# Patient Record
Sex: Female | Born: 1955 | Race: White | Hispanic: No | Smoking: Current every day smoker
Health system: Southern US, Community
[De-identification: ages and names within clinical notes are randomized; demographics above are authoritative.]

## PROBLEM LIST (undated history)

## (undated) DIAGNOSIS — M545 Low back pain, unspecified: Secondary | ICD-10-CM

## (undated) DIAGNOSIS — K219 Gastro-esophageal reflux disease without esophagitis: Secondary | ICD-10-CM

## (undated) DIAGNOSIS — S42401A Unspecified fracture of lower end of right humerus, initial encounter for closed fracture: Secondary | ICD-10-CM

## (undated) DIAGNOSIS — I509 Heart failure, unspecified: Secondary | ICD-10-CM

## (undated) DIAGNOSIS — R0602 Shortness of breath: Secondary | ICD-10-CM

## (undated) DIAGNOSIS — M5136 Other intervertebral disc degeneration, lumbar region: Secondary | ICD-10-CM

## (undated) DIAGNOSIS — R609 Edema, unspecified: Secondary | ICD-10-CM

## (undated) DIAGNOSIS — T148XXA Other injury of unspecified body region, initial encounter: Secondary | ICD-10-CM

## (undated) DIAGNOSIS — M1990 Unspecified osteoarthritis, unspecified site: Secondary | ICD-10-CM

## (undated) DIAGNOSIS — F419 Anxiety disorder, unspecified: Secondary | ICD-10-CM

## (undated) DIAGNOSIS — M51369 Other intervertebral disc degeneration, lumbar region without mention of lumbar back pain or lower extremity pain: Secondary | ICD-10-CM

## (undated) DIAGNOSIS — G8929 Other chronic pain: Secondary | ICD-10-CM

## (undated) DIAGNOSIS — E785 Hyperlipidemia, unspecified: Secondary | ICD-10-CM

## (undated) DIAGNOSIS — I10 Essential (primary) hypertension: Secondary | ICD-10-CM

## (undated) DIAGNOSIS — M5126 Other intervertebral disc displacement, lumbar region: Secondary | ICD-10-CM

## (undated) HISTORY — PX: FRACTURE SURGERY: SHX138

## (undated) HISTORY — DX: Heart failure, unspecified: I50.9

## (undated) HISTORY — DX: Anxiety disorder, unspecified: F41.9

## (undated) HISTORY — DX: Other injury of unspecified body region, initial encounter: T14.8XXA

## (undated) HISTORY — DX: Hyperlipidemia, unspecified: E78.5

## (undated) HISTORY — PX: TARSAL TUNNEL RELEASE: SUR1099

---

## 2000-12-16 HISTORY — PX: HEEL SPUR SURGERY: SHX665

## 2001-07-10 ENCOUNTER — Encounter (HOSPITAL_COMMUNITY): Admission: RE | Admit: 2001-07-10 | Discharge: 2001-08-09 | Payer: Self-pay | Admitting: Podiatry

## 2002-03-22 ENCOUNTER — Encounter: Payer: Self-pay | Admitting: Family Medicine

## 2002-03-22 ENCOUNTER — Ambulatory Visit (HOSPITAL_COMMUNITY): Admission: RE | Admit: 2002-03-22 | Discharge: 2002-03-22 | Payer: Self-pay | Admitting: Family Medicine

## 2002-11-22 ENCOUNTER — Ambulatory Visit (HOSPITAL_COMMUNITY): Admission: RE | Admit: 2002-11-22 | Discharge: 2002-11-22 | Payer: Self-pay | Admitting: Anesthesiology

## 2002-11-22 ENCOUNTER — Encounter: Payer: Self-pay | Admitting: Anesthesiology

## 2003-02-21 ENCOUNTER — Encounter: Payer: Self-pay | Admitting: Emergency Medicine

## 2003-02-21 ENCOUNTER — Emergency Department (HOSPITAL_COMMUNITY): Admission: EM | Admit: 2003-02-21 | Discharge: 2003-02-21 | Payer: Self-pay | Admitting: Emergency Medicine

## 2005-04-23 ENCOUNTER — Encounter (HOSPITAL_COMMUNITY): Admission: RE | Admit: 2005-04-23 | Discharge: 2005-05-23 | Payer: Self-pay | Admitting: Podiatry

## 2005-12-04 ENCOUNTER — Ambulatory Visit (HOSPITAL_COMMUNITY): Admission: RE | Admit: 2005-12-04 | Discharge: 2005-12-04 | Payer: Self-pay | Admitting: Family Medicine

## 2005-12-10 ENCOUNTER — Ambulatory Visit (HOSPITAL_COMMUNITY): Admission: RE | Admit: 2005-12-10 | Discharge: 2005-12-10 | Payer: Self-pay | Admitting: Family Medicine

## 2005-12-11 ENCOUNTER — Encounter (HOSPITAL_COMMUNITY): Admission: RE | Admit: 2005-12-11 | Discharge: 2005-12-11 | Payer: Self-pay | Admitting: Family Medicine

## 2005-12-17 ENCOUNTER — Encounter (HOSPITAL_COMMUNITY): Admission: RE | Admit: 2005-12-17 | Discharge: 2006-01-16 | Payer: Self-pay | Admitting: Family Medicine

## 2009-04-03 ENCOUNTER — Other Ambulatory Visit: Admission: RE | Admit: 2009-04-03 | Discharge: 2009-04-03 | Payer: Self-pay | Admitting: Obstetrics & Gynecology

## 2009-04-10 ENCOUNTER — Ambulatory Visit (HOSPITAL_COMMUNITY): Admission: RE | Admit: 2009-04-10 | Discharge: 2009-04-10 | Payer: Self-pay | Admitting: Obstetrics & Gynecology

## 2009-04-24 ENCOUNTER — Ambulatory Visit (HOSPITAL_COMMUNITY): Admission: RE | Admit: 2009-04-24 | Discharge: 2009-04-24 | Payer: Self-pay | Admitting: Family Medicine

## 2009-05-04 ENCOUNTER — Ambulatory Visit (HOSPITAL_COMMUNITY): Admission: RE | Admit: 2009-05-04 | Discharge: 2009-05-04 | Payer: Self-pay | Admitting: Obstetrics & Gynecology

## 2010-03-30 ENCOUNTER — Ambulatory Visit (HOSPITAL_COMMUNITY): Admission: RE | Admit: 2010-03-30 | Discharge: 2010-03-30 | Payer: Self-pay | Admitting: Family Medicine

## 2010-04-10 ENCOUNTER — Ambulatory Visit (HOSPITAL_COMMUNITY): Admission: RE | Admit: 2010-04-10 | Discharge: 2010-04-10 | Payer: Self-pay | Admitting: Family Medicine

## 2010-04-26 ENCOUNTER — Encounter: Admission: RE | Admit: 2010-04-26 | Discharge: 2010-04-26 | Payer: Self-pay | Admitting: Family Medicine

## 2011-01-10 ENCOUNTER — Ambulatory Visit (HOSPITAL_COMMUNITY)
Admission: RE | Admit: 2011-01-10 | Discharge: 2011-01-10 | Payer: Self-pay | Source: Home / Self Care | Attending: Family Medicine | Admitting: Family Medicine

## 2011-01-21 ENCOUNTER — Ambulatory Visit (HOSPITAL_COMMUNITY)
Admission: RE | Admit: 2011-01-21 | Discharge: 2011-01-21 | Disposition: A | Discharge status: Home / Self Care | Payer: Medicare Other | Source: Ambulatory Visit | Attending: Family Medicine | Admitting: Family Medicine

## 2011-01-21 ENCOUNTER — Encounter (HOSPITAL_COMMUNITY): Payer: Self-pay

## 2011-01-21 ENCOUNTER — Other Ambulatory Visit (HOSPITAL_COMMUNITY): Payer: Self-pay | Admitting: Family Medicine

## 2011-01-21 DIAGNOSIS — M545 Low back pain, unspecified: Secondary | ICD-10-CM | POA: Insufficient documentation

## 2011-01-21 DIAGNOSIS — R531 Weakness: Secondary | ICD-10-CM

## 2011-01-21 HISTORY — DX: Essential (primary) hypertension: I10

## 2011-02-18 ENCOUNTER — Other Ambulatory Visit: Payer: Self-pay | Admitting: Family Medicine

## 2011-02-18 DIAGNOSIS — M549 Dorsalgia, unspecified: Secondary | ICD-10-CM

## 2011-03-04 ENCOUNTER — Ambulatory Visit
Admission: RE | Admit: 2011-03-04 | Discharge: 2011-03-04 | Disposition: A | Discharge status: Home / Self Care | Payer: Medicare Other | Source: Ambulatory Visit | Attending: Family Medicine | Admitting: Family Medicine

## 2011-03-04 DIAGNOSIS — M549 Dorsalgia, unspecified: Secondary | ICD-10-CM

## 2014-03-08 ENCOUNTER — Other Ambulatory Visit (HOSPITAL_COMMUNITY): Payer: Self-pay | Admitting: Pain Medicine

## 2014-03-08 ENCOUNTER — Ambulatory Visit (HOSPITAL_COMMUNITY)
Admission: RE | Admit: 2014-03-08 | Discharge: 2014-03-08 | Disposition: A | Discharge status: Home / Self Care | Payer: Medicare Other | Source: Ambulatory Visit | Attending: Pain Medicine | Admitting: Pain Medicine

## 2014-03-08 DIAGNOSIS — IMO0002 Reserved for concepts with insufficient information to code with codable children: Secondary | ICD-10-CM | POA: Insufficient documentation

## 2014-03-08 DIAGNOSIS — M545 Low back pain, unspecified: Secondary | ICD-10-CM

## 2014-03-08 DIAGNOSIS — M5430 Sciatica, unspecified side: Secondary | ICD-10-CM

## 2014-03-08 DIAGNOSIS — M1710 Unilateral primary osteoarthritis, unspecified knee: Secondary | ICD-10-CM | POA: Insufficient documentation

## 2014-03-08 DIAGNOSIS — M25569 Pain in unspecified knee: Secondary | ICD-10-CM | POA: Insufficient documentation

## 2015-08-24 ENCOUNTER — Ambulatory Visit (INDEPENDENT_AMBULATORY_CARE_PROVIDER_SITE_OTHER): Payer: Medicare Other | Admitting: Adult Health

## 2015-08-24 ENCOUNTER — Encounter: Payer: Self-pay | Admitting: Adult Health

## 2015-08-24 ENCOUNTER — Other Ambulatory Visit (HOSPITAL_COMMUNITY)
Admission: RE | Admit: 2015-08-24 | Discharge: 2015-08-24 | Disposition: A | Discharge status: Home / Self Care | Payer: Medicare Other | Source: Ambulatory Visit | Attending: Adult Health | Admitting: Adult Health

## 2015-08-24 VITALS — BP 100/60 | HR 92 | Ht 62.0 in | Wt 226.5 lb

## 2015-08-24 DIAGNOSIS — Z124 Encounter for screening for malignant neoplasm of cervix: Secondary | ICD-10-CM | POA: Insufficient documentation

## 2015-08-24 DIAGNOSIS — Z1151 Encounter for screening for human papillomavirus (HPV): Secondary | ICD-10-CM | POA: Insufficient documentation

## 2015-08-24 DIAGNOSIS — Z1212 Encounter for screening for malignant neoplasm of rectum: Secondary | ICD-10-CM

## 2015-08-24 DIAGNOSIS — Z139 Encounter for screening, unspecified: Secondary | ICD-10-CM

## 2015-08-24 LAB — HEMOCCULT GUIAC POC 1CARD (OFFICE): FECAL OCCULT BLD: NEGATIVE

## 2015-08-24 NOTE — Progress Notes (Signed)
Subjective:     Patient ID: Marie Juarez, female   DOB: Aug 07, 1956, 59 y.o.   MRN: [id number]  HPI Marie Juarez is a 59 year old white female,widowed in for pelvic exam and pap, last pap about 2010.She had her physical with Dr Claudette Laws PCP.  Review of Systems Patient denies any headaches, hearing loss, fatigue, blurred vision, shortness of breath, chest pain, abdominal pain, problems with bowel movements, urination, or intercourse(not having sex) . No joint pain or mood swings.Does have pain in feet and legs from nerve damage.  Reviewed past medical,surgical, social and family history. Reviewed medications and allergies.     Objective:   Physical Exam BP 100/60 mmHg  Pulse 92  Ht  (1.575 m)  Wt 226 lb 8 oz (102.74 kg)  BMI 41.42 kg/m2 Skin warm and dry.Pelvic: external genitalia is normal in appearance no lesions, vagina: has decreased color, moisture and rugae,urethra has no lesions or masses noted, cervix:smooth and stenotic at os, pap with HPV performed, uterus: normal size, shape and contour, non tender, no masses felt, adnexa: no masses or tenderness noted. Bladder is non tender and no masses felt. On rectal exam no masses felt, hemoccult negative, discussed if sees any vaginal bleeding let us know, and needs to get mammogram and colonoscopy.    Assessment:     Pelvic exam with pap    Plan:     Pap in 3 years if normal Get mammogram  Colonoscopy advised

## 2015-08-24 NOTE — Patient Instructions (Signed)
Pap smear in 3 years if this one normal Get mammogram Colonoscopy advised

## 2015-08-28 LAB — CYTOLOGY - PAP

## 2016-11-23 ENCOUNTER — Emergency Department (HOSPITAL_COMMUNITY)
Admission: EM | Admit: 2016-11-23 | Discharge: 2016-11-23 | Disposition: A | Discharge status: Home / Self Care | Payer: Medicare Other | Attending: Emergency Medicine | Admitting: Emergency Medicine

## 2016-11-23 ENCOUNTER — Encounter (HOSPITAL_COMMUNITY): Payer: Self-pay | Admitting: *Deleted

## 2016-11-23 ENCOUNTER — Emergency Department (HOSPITAL_COMMUNITY): Payer: Medicare Other

## 2016-11-23 DIAGNOSIS — S53104A Unspecified dislocation of right ulnohumeral joint, initial encounter: Secondary | ICD-10-CM

## 2016-11-23 DIAGNOSIS — I10 Essential (primary) hypertension: Secondary | ICD-10-CM | POA: Insufficient documentation

## 2016-11-23 DIAGNOSIS — Z7982 Long term (current) use of aspirin: Secondary | ICD-10-CM | POA: Insufficient documentation

## 2016-11-23 DIAGNOSIS — F17210 Nicotine dependence, cigarettes, uncomplicated: Secondary | ICD-10-CM | POA: Insufficient documentation

## 2016-11-23 DIAGNOSIS — Y92009 Unspecified place in unspecified non-institutional (private) residence as the place of occurrence of the external cause: Secondary | ICD-10-CM | POA: Insufficient documentation

## 2016-11-23 DIAGNOSIS — Y939 Activity, unspecified: Secondary | ICD-10-CM | POA: Diagnosis not present

## 2016-11-23 DIAGNOSIS — S52001A Unspecified fracture of upper end of right ulna, initial encounter for closed fracture: Secondary | ICD-10-CM | POA: Insufficient documentation

## 2016-11-23 DIAGNOSIS — S53121A Posterior subluxation of right ulnohumeral joint, initial encounter: Secondary | ICD-10-CM | POA: Insufficient documentation

## 2016-11-23 DIAGNOSIS — Z79899 Other long term (current) drug therapy: Secondary | ICD-10-CM | POA: Diagnosis not present

## 2016-11-23 DIAGNOSIS — S52124A Nondisplaced fracture of head of right radius, initial encounter for closed fracture: Secondary | ICD-10-CM | POA: Diagnosis not present

## 2016-11-23 DIAGNOSIS — Y999 Unspecified external cause status: Secondary | ICD-10-CM | POA: Insufficient documentation

## 2016-11-23 DIAGNOSIS — W000XXA Fall on same level due to ice and snow, initial encounter: Secondary | ICD-10-CM | POA: Insufficient documentation

## 2016-11-23 DIAGNOSIS — S59901A Unspecified injury of right elbow, initial encounter: Secondary | ICD-10-CM | POA: Diagnosis present

## 2016-11-23 MED ORDER — PROPOFOL 10 MG/ML IV BOLUS
0.5000 mg/kg | INTRAVENOUS | Status: DC | PRN
  Administered 2016-11-23 (×2): 44.5 mg via INTRAVENOUS
  Filled 2016-11-23: qty 20

## 2016-11-23 MED ORDER — HYDROCODONE-ACETAMINOPHEN 5-325 MG PO TABS: ORAL_TABLET | ORAL | 0 refills | Status: DC

## 2016-11-23 MED ORDER — FENTANYL CITRATE (PF) 100 MCG/2ML IJ SOLN
50.0000 ug | INTRAMUSCULAR | Status: DC | PRN
  Administered 2016-11-23: 50 ug via INTRAVENOUS
  Filled 2016-11-23: qty 2

## 2016-11-23 NOTE — Discharge Instructions (Signed)
Take the prescription as directed.  Apply ice to the area(s) of discomfort, for 15 minutes at a time, several times per day for the next few days.  Do not fall asleep on an ice pack. Wear the splint and sling until you are seen in follow up by the Orthopedist. Call the Orthopedist on Monday to schedule a follow up appointment in the next 3 days.  Return to the Emergency Department immediately if worsening.

## 2016-11-23 NOTE — ED Provider Notes (Signed)
[facility]PT Provider Note   CSN: [id number] Arrival date & time: 11/23/16  1627     History   Chief Complaint Chief Complaint  Patient presents with  . Arm Pain    HPI Marie Juarez is a 60 y.o. female.  HPI Pt was seen at 1720. Per pt, c/o sudden onset and persistence of constant right elbow "pain" that began PTA. Pt states she slipped and fell, landing onto her right arm.  States she is unable to straighten her elbow. Denies any other injuries. Denies focal motor weakness, no tingling/numbness in extremities, no head injury, no neck or back pain.   Past Medical History:  Diagnosis Date  . Anxiety   . Hyperlipidemia   . Hypertension   . 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   Past Surgical History:  Procedure Laterality Date  . CESAREAN SECTION    . HEEL SPUR SURGERY    . TARSAL TUNNEL RELEASE Left     OB History    Gravida Para Term Preterm AB Living   1 1       1    SAB TAB Ectopic Multiple Live Births                   Home Medications    Prior to Admission medications   Medication Sig Start Date End Date Taking? Authorizing Provider  ALPRAZolam (XANAX) 0.25 MG tablet Take 0.25 mg by mouth 3 (three) times daily as needed for anxiety.    Historical Provider, MD  gabapentin (NEURONTIN) 800 MG tablet Take 800 mg by mouth 4 (four) times daily.    Historical Provider, MD  HYDROcodone-acetaminophen (NORCO) 10-325 MG per tablet Take 1 tablet by mouth every 6 (six) hours as needed.    Historical Provider, MD  lisinopril (PRINIVIL,ZESTRIL) 10 MG tablet Take 10 mg by mouth daily.    Historical Provider, MD  nabumetone (RELAFEN) 750 MG tablet Take 750 mg by mouth 2 (two) times daily.    Historical Provider, MD  simvastatin (ZOCOR) 40 MG tablet Take 40 mg by mouth daily.    Historical Provider, MD  triamterene-hydrochlorothiazide (DYAZIDE) 37.5-25 MG per capsule Take by mouth daily. Takes 1-2 tabs once  daily    Historical Provider, MD    Family History Family History  Problem Relation Age of Onset  . Diabetes Mother   . Hypertension Mother   . Stroke Father   . Diabetes Sister   . Hypertension Sister   . Diabetes Brother     borderline  . Diabetes Maternal Grandmother   . Hypertension Maternal Grandmother   . Stroke Maternal Grandfather   . Diabetes Brother   . Diabetes Brother     Social History Social History  Substance Use Topics  . Smoking status: Current Every Day Smoker    Packs/day: 1.50    Years: 30.00    Types: Cigarettes  . Smokeless tobacco: Never Used  . Alcohol use No     Allergies   Keflex [cephalexin]   Review of Systems Review of Systems ROS: Statement: All systems negative except as marked or noted in the HPI; Constitutional: Negative for fever and chills. ; ; Eyes: Negative for eye pain, redness and discharge. ; ; ENMT: Negative for ear pain, hoarseness, nasal congestion, sinus pressure and sore throat. ; ; Cardiovascular: Negative for chest pain, palpitations, diaphoresis, dyspnea and peripheral edema. ; ; Respiratory: Negative  for cough, wheezing and stridor. ; ; Gastrointestinal: Negative for nausea, vomiting, diarrhea, abdominal pain, blood in stool, hematemesis, jaundice and rectal bleeding. . ; ; Genitourinary: Negative for dysuria, flank pain and hematuria. ; ; Musculoskeletal: +right elbow pain. Negative for back pain and neck pain. Negative for swelling.; ; Skin: Negative for pruritus, rash, abrasions, blisters, bruising and skin lesion.; ; Neuro: Negative for headache, lightheadedness and neck stiffness. Negative for weakness, altered level of consciousness, altered mental status, extremity weakness, paresthesias, involuntary movement, seizure and syncope.      Physical Exam Updated Vital Signs BP 157/77 (BP Location: Left Arm)   Pulse 96   Temp 98.1 F (36.7 C)   Resp 16   Ht 5\' 1"  (1.549 m)   Wt 196 lb (88.9 kg)   SpO2 99%   BMI  37.03 kg/m   Physical Exam 1725: Physical examination:  Nursing notes reviewed; Vital signs and O2 SAT reviewed;  Constitutional: Well developed, Well nourished, Well hydrated, In no acute distress; Head:  Normocephalic, atraumatic; Eyes: EOMI, PERRL, No scleral icterus; ENMT: Mouth and pharynx normal, Mucous membranes moist; Neck: Supple, Full range of motion, No lymphadenopathy; Cardiovascular: Regular rate and rhythm, No murmur, rub, or gallop; Respiratory: Breath sounds clear & equal bilaterally, No rales, rhonchi, wheezes.  Speaking full sentences with ease, Normal respiratory effort/excursion; Chest: Nontender, Movement normal; Abdomen: Soft, Nontender, Nondistended, Normal bowel sounds; Genitourinary: No CVA tenderness; Extremities: Pulses normal, +right elbow deformity, tenderness, no erythema, no open wounds. Muscles compartments soft, strong radial pulse, NMS intact right hand. NT right shoulder/wrist/hand..; Neuro: AA&Ox3, Major CN grossly intact.  Speech clear. No gross focal motor or sensory deficits in extremities.; Skin: Color normal, Warm, Dry.   ED Treatments / Results  Labs (all labs ordered are listed, but only abnormal results are displayed)   EKG  EKG Interpretation None       Radiology   Procedures Procedures (including critical care time)   Preprocedure Pre-anesthesia/induction confirmation of laterality/correct procedure site including "time-out."  Provider confirms review of the nurses' note, allergies, medications, pertinent labs, PMH, pre-induction vital signs, pulse oximetry, pain level, and ECG (as applicable), and patient condition satisfactory for commencing with order for sedation and procedure.  Procedural sedation:  Presedation checklist: Informed consent, including risks and benefits, obtained. Pre-procedural timeout performed with Pre-sedation confirmation of laterality/correct procedure site.  Provider confirms review of the nurses' note, allergies,  medications, pertinent labs, PMH, pre-induction vital signs, pulse oximetry, pain level, and patient condition satisfactory for commencing with order for sedation/procedure.  Awake and alert, Patient on monitor, Continuous pulse oximetry, Supplemental oxygen provided, NPO status verified, Suction available, Sedation reversal agent(s) at bedside as applicable; Resuscitative equipment readily available. Last meal: 6+ hours ago;  Physician time: 15 minutes; ASA Classification: 1. Normal healthy patient.;  Agent: Etomidate, Fentanyl, Propofol; Level: Moderate;  Complications: None; Treatment: None;  Reassessment: Awake, Alert, Oriented, Vital signs normal, Returned to presedation baseline, Procedure completed successfully, No complaints.  Postprocedure Patient tolerated procedure and procedural sedation component as expected without apparent immediate complications.  Physician confirms procedural medication orders as administered, patient was assessed by physician post-procedure, and confirms post-sedation plan of care and disposition.   Orthopedic Procedure: Timeout: Pre-procedural timeout;  Indication: Dislocation; Joint: Right elbow; Description: Closed;  Procedure: Informed consent obtained,  Procedural sedation (see note),  Sterile technique used, Closed reduction of dislocation, By traction/counter-traction,  Immobilization: By myself, Assisted by ED RN and ED Tech, Long arm posterior splint/sling with forearm in pronation; Post-reduction  xray obtained; Reassessment: Pain improved, Neurovascularly intact, strong radial pulse. Anatomic alignment restored.     Medications Ordered in ED Medications - No data to display   Initial Impression / Assessment and Plan / ED Course  I have reviewed the triage vital signs and the nursing notes.  Pertinent labs & imaging results that were available during my care of the patient were reviewed by me and considered in my medical decision making (see  chart for details).  MDM Reviewed: nursing note and vitals Interpretation: x-ray   Dg Elbow Complete Right Result Date: 11/23/2016 CLINICAL DATA:  Slipped and fell today in her home, pain and obvious deformity EXAM: RIGHT ELBOW - COMPLETE 3+ VIEW COMPARISON:  None FINDINGS: Diffuse osseous demineralization. Posterior RIGHT elbow dislocation. Intra-articular RIGHT radial head fracture with displaced fracture fragment. Additional fracture fragment is seen anterior to the distal humerus on the lateral view, likely having arisen from the coronoid process of the ulna. No additional fracture or bone destruction. Associated soft tissue swelling and deformity. IMPRESSION: Posterior elbow dislocation with displaced intra-articular fracture of the RIGHT radial head and probable displaced coronoid process ulna fracture. Electronically Signed   By: Ulyses SouthwardMark  Boles M.D.   On: 11/23/2016 17:26   Dg Humerus Right Result Date: 11/23/2016 CLINICAL DATA:  Larey SeatFell in home today, pain and obvious deformity RIGHT elbow EXAM: RIGHT HUMERUS - 2+ VIEW COMPARISON:  RIGHT elbow radiographs 11/23/2016 FINDINGS: Osseous demineralization. Glenohumeral alignment grossly normal. Posterior elbow dislocation again identified. Displaced intra-articular fracture fragment arising from the RIGHT radial head. Additional fracture fragment seen anterior to the distal humerus suspect arising from the coronoid process of the ulna. Shaft of the RIGHT humerus appears grossly intact. IMPRESSION: Posterior RIGHT elbow dislocation with intra-articular RIGHT radial head and coronoid process ulna fractures. Electronically Signed   By: Ulyses SouthwardMark  Boles M.D.   On: 11/23/2016 17:35    Dg Elbow 2 Views Right Result Date: 11/23/2016 CLINICAL DATA:  Post reduction EXAM: RIGHT ELBOW - 2 VIEW COMPARISON:  11/23/2016 FINDINGS: Previously identified posterior elbow dislocation appears reduced on single lateral. Displaced bone fragment again identified anterior to the  distal humerus on lateral view, question arising from the coronoid process of the ulna. Displaced radial head fracture fragment seen on the previous exam is not well localized. Fiberglass splint material noted. IMPRESSION: Apparent reduction of previously identified posterior RIGHT elbow dislocation on single lateral view. Displaced fracture fragment anterior to the distal humerus again identified. Electronically Signed   By: Ulyses SouthwardMark  Boles M.D.   On: 11/23/2016 20:16    1810:  T/C to Ortho Dr. Roda ShuttersXu, case discussed, including:  HPI, pertinent PM/SHx, VS/PE, dx testing, ED course and treatment: Radial head should fall into place when reducing ulna, if unstable after reduction (which is common with fractures such as this pt's), splint pt's arm in 90 degree angle with hand pronated (vs neutral positioning), f/u office Monday.   2055:  Reduction completed. Pt has tol PO well without N/V, ambulated with steady gait. Pt states she feels better and would like to go home now. Dx and testing d/w pt.  Questions answered.  Verb understanding, agreeable to d/c home with outpt f/u.    Final Clinical Impressions(s) / ED Diagnoses   Final diagnoses:  None    New Prescriptions New Prescriptions   No medications on file     Samuel JesterKathleen Desi Carby, DO 11/28/16 2024

## 2016-11-23 NOTE — ED Triage Notes (Addendum)
Pt reports she fell on the ice today and hurt her right arm/elbow. Pt unable to extend right elbow. Strong right radial pulses. No numbness/tingling to right arm/hand.

## 2016-11-23 NOTE — ED Notes (Signed)
I have pt dressed and ready to go wait on nurse for discharge papers

## 2016-11-28 ENCOUNTER — Ambulatory Visit: Payer: Medicare Other | Admitting: Orthopaedic Surgery

## 2016-11-29 ENCOUNTER — Encounter: Payer: Self-pay | Admitting: Orthopedic Surgery

## 2016-11-29 ENCOUNTER — Ambulatory Visit (INDEPENDENT_AMBULATORY_CARE_PROVIDER_SITE_OTHER): Payer: Medicare Other | Admitting: Orthopedic Surgery

## 2016-11-29 VITALS — BP 117/81 | HR 98 | Ht 63.0 in | Wt 184.0 lb

## 2016-11-29 DIAGNOSIS — S42401A Unspecified fracture of lower end of right humerus, initial encounter for closed fracture: Secondary | ICD-10-CM | POA: Diagnosis not present

## 2016-11-29 NOTE — Progress Notes (Signed)
Patient ID: Marie Juarez, female   DOB: 06/13/1956, 60 y.o.   MRN: [id number]  Chief Complaint  Patient presents with  . Elbow Injury    ER folllow up on right elbow dislocation, DOI 11-23-16.    HPI Marie Juarez is a 60 y.o. female.  60 year old female presents with 6 day history of right elbow fracture dislocation seen in the emergency room were closed reduction was performed but residual radial head fracture and ulnar  fracture is noted at the coronoid  Primarily complains of mild to moderate elbow pain and swelling of the hand without numbness or tingling.  Review of Systems Review of Systems 1. Denies numbness or tingling the right upper extremity 2. Joint swelling related to the hand History degenerative disc disease   Past Medical History:  Diagnosis Date  . Anxiety   . Hyperlipidemia   . Hypertension   . 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 CESAREAN SECTION    . HEEL SPUR SURGERY    . TARSAL TUNNEL RELEASE Left     Social History Social History  Substance Use Topics  . Smoking status: Current Every Day Smoker    Packs/day: 1.50    Years: 30.00    Types: Cigarettes  . Smokeless tobacco: Never Used  . Alcohol use No    Allergies  Allergen Reactions  . Keflex [Cephalexin] Hives and Itching    No outpatient prescriptions have been marked as taking for the 11/29/16 encounter (Office Visit) with Vickki HearingStanley E Sirinity Outland, MD.      Physical Exam Physical Exam BP 117/81   Pulse 98   Ht 5\' 3"  (1.6 m)   Wt 184 lb (83.5 kg)   BMI 32.59 kg/m   Gen. appearance. The patient is well-developed and well-nourished, grooming and hygiene are normal. There are no gross congenital abnormalities  The patient is alert and oriented to person place and time  Mood and affect are normal  Ambulation Normal  Examination reveals the following: On inspection we find swelling in the right hand tenderness and swelling  loss of motion in the right elbow. Stability tests were deferred because of the nature of the injury and the pain in the elbow strength testing grip are normal in the hand skin showed erythema sensation was normal pulses good despite the edema  Left elbow full range of motion stability and strength normal Data Reviewed Pre-and post reduction films were reviewed  She had a fracture dislocation of the elbow on the right  Post reduction films show residual bone fragment most likely coming from the radial head but possibly the coronoid process  Assessment    Encounter Diagnosis  Name Primary?  . Closed fracture dislocation of right elbow, initial encounter Yes       Plan    Recommend referral to tertiary care center for the turbo Triad injury  I placed her in a new splint          Fuller CanadaStanley Machell Wirthlin 11/29/2016, 11:12 AM

## 2016-12-02 ENCOUNTER — Telehealth: Payer: Self-pay | Admitting: Orthopedic Surgery

## 2016-12-02 NOTE — Telephone Encounter (Signed)
yes

## 2016-12-02 NOTE — Telephone Encounter (Signed)
ROUTING TO DR HARRISON TO ADVISE 

## 2016-12-02 NOTE — Telephone Encounter (Signed)
REFERRAL FAXED TO [facility]

## 2016-12-02 NOTE — Telephone Encounter (Signed)
Patient called asking if she could be referred to somewhere in [place] for [place] dislocated elbow [facility]. She stated that she has no way up there and her family members told her that she needs to go to [place].    Please call and advise.

## 2016-12-06 NOTE — Addendum Note (Signed)
Addended by: Adella HareBOOTHE, JAIME B on: 12/06/2016 10:52 AM   Modules accepted: Orders

## 2016-12-06 NOTE — Telephone Encounter (Signed)
Patient called to check on status of referral, per her request to see hand specialist in [place], Dr Amanda PeaGramig. I contacted [facility] to follow up. Per Victorino DikeJennifer, states  Dr Amanda PeaGramig reviewd notes, and will be unable to see patient. Mentioned recommendation of trying Dr Roda ShuttersXu or [facility] or [place].  They have not yet advise patient of this information.

## 2016-12-06 NOTE — Telephone Encounter (Signed)
Please advise patient and let her know I have put in referral with Dr Roda ShuttersXu

## 2016-12-11 NOTE — Telephone Encounter (Signed)
I had called back to patient Friday, 12/06/16, and notified.

## 2016-12-17 ENCOUNTER — Encounter (INDEPENDENT_AMBULATORY_CARE_PROVIDER_SITE_OTHER): Payer: Self-pay | Admitting: Orthopaedic Surgery

## 2016-12-17 ENCOUNTER — Ambulatory Visit (INDEPENDENT_AMBULATORY_CARE_PROVIDER_SITE_OTHER): Payer: Medicare Other | Admitting: Orthopaedic Surgery

## 2016-12-17 ENCOUNTER — Ambulatory Visit (INDEPENDENT_AMBULATORY_CARE_PROVIDER_SITE_OTHER): Payer: Medicare Other

## 2016-12-17 DIAGNOSIS — S53114A Anterior dislocation of right ulnohumeral joint, initial encounter: Secondary | ICD-10-CM

## 2016-12-17 DIAGNOSIS — M25521 Pain in right elbow: Secondary | ICD-10-CM | POA: Insufficient documentation

## 2016-12-17 NOTE — Progress Notes (Signed)
Office Visit Note   [name]           Date of Birth: 06/01/1956           MRN: [id number] Visit Date: 12/17/2016              Requested by: Pearson GrippeJames Kim, MD [address] [place], [place] [id number] PCP: Pearson GrippeJames Kim, MD   Assessment & Plan: Visit Diagnoses:  1. 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  Patient refused splint.  Urgent referral to Neil Crouchave Thompson at [facility] made. Will need surgery outside scope of my practice.  Patient understands.  Follow-Up Instructions: Return if symptoms worsen or fail to improve.   Orders:  Orders Placed This Encounter  Procedures  . XR Elbow 2 Views Right  . Ambulatory referral to Orthopedic Surgery   No orders of the defined types were placed in this encounter.     Procedures: No procedures performed   Clinical Data: No additional findings.   Subjective: Chief Complaint  Patient presents with  . Right Elbow - Pain    Patient f/u today with me for the first time s/p right elbow terrible triad injury 4 weeks ago.  Was evaluated and reduced in ER.  xrays show concentric post reduction xrays but no xrays in splint are available for review.  Patient has had pain and swelling of the right hand and elbow.  Pain radiates down the arm.  She's RHD.  She's been in a splint since the ER visit.      Review of Systems  Constitutional: Negative.   HENT: Negative.   Eyes: Negative.   Respiratory: Negative.   Cardiovascular: Negative.   Endocrine: Negative.   Musculoskeletal: Negative.   Neurological: Negative.   Hematological: Negative.   Psychiatric/Behavioral: Negative.   All other systems reviewed and are negative.    Objective: Vital Signs: There were no vitals taken for this visit.  Physical Exam  Constitutional: She is oriented to person, place, and time. She appears well-developed and well-nourished.  Pulmonary/Chest: Effort  normal.  Neurological: She is alert and oriented to person, place, and time.  Skin: Skin is warm. Capillary refill takes less than 2 seconds.  Psychiatric: She has a normal mood and affect. Her behavior is normal. Judgment and thought content normal.  Nursing note and vitals reviewed.   Right Elbow Exam   Comments:  NVI distally.  I attempted to reduce her elbow but nothing moved and she didn't feel any real pain from manipulation.        Specialty Comments:  No specialty comments available.  Imaging: No results found.   PMFS History: There are no active problems to display for this patient.  Past Medical History:  Diagnosis Date  . Anxiety   . Hyperlipidemia   . Hypertension   . 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 Diabetes Mother   . Hypertension Mother   . Stroke Father   . Diabetes Sister   . Hypertension Sister   . Diabetes Brother     borderline  . Diabetes Maternal Grandmother   . Hypertension Maternal Grandmother   . Stroke Maternal Grandfather   . Diabetes Brother   . Diabetes Brother     Past Surgical History:  Procedure Laterality Date  . CESAREAN SECTION    .  HEEL SPUR SURGERY    . TARSAL TUNNEL RELEASE Left    Social History   Occupational History  . Not on file.   Social History Main Topics  . Smoking status: Current Every Day Smoker    Packs/day: 1.50    Years: 30.00    Types: Cigarettes  . Smokeless tobacco: Never Used  . Alcohol use No  . Drug use: No  . Sexual activity: Not Currently    Birth control/ protection: Post-menopausal

## 2016-12-17 NOTE — Addendum Note (Signed)
Addended by: Albertina ParrGARCIA, Verner Mccrone on: 12/17/2016 01:06 PM   Modules accepted: Orders

## 2016-12-18 ENCOUNTER — Other Ambulatory Visit: Payer: Medicare Other

## 2016-12-18 ENCOUNTER — Other Ambulatory Visit: Payer: Self-pay | Admitting: Orthopedic Surgery

## 2016-12-18 ENCOUNTER — Encounter (HOSPITAL_COMMUNITY): Payer: Self-pay | Admitting: *Deleted

## 2016-12-18 NOTE — H&P (Signed)
Marie Juarez is an 61 y.o. female.   CC / Reason for Visit: Right elbow problem HPI: This patient is a 61 year old RHD disabled female who presents for evaluation of a right elbow injury that occurred on the date above.  She apparently slipped and fell, landing onto an outstretched right arm.  She was evaluated in the emergency department in [place], where x-rays were obtained and closure reduction was performed.  She was placed into a splint.  She was subsequently evaluated by Dr. Fuller CanadaStanley Harrison, who recommended tertiary referral.  Patient was next evaluated by Dr. Glee ArvinMichael Xu on 12-17-16, at which time the joint was found to be dislocated and not reducible in the office. She is under a pain contract, taking Norco 10/325 4 times a day for her back and neck pain.  She has not taken any extra pain medicines for this new injury and she has also not conferred with her treating pain physician regarding the perioperative plan for pain management.  Past Medical History:  Diagnosis Date  . Anxiety   . Dyspnea    with exertion  . Edema   . Fracture dislocation of right elbow joint   . GERD (gastroesophageal reflux disease)   . Hyperlipidemia   . Hypertension   . 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 CESAREAN SECTION    . HEEL SPUR SURGERY    . TARSAL TUNNEL RELEASE Left     Family History  Problem Relation Age of Onset  . Diabetes Mother   . Hypertension Mother   . Stroke Father   . Diabetes Sister   . Hypertension Sister   . Diabetes Brother     borderline  . Diabetes Maternal Grandmother   . Hypertension Maternal Grandmother   . Stroke Maternal Grandfather   . Diabetes Brother   . Diabetes Brother    Social History:  reports that she has been smoking Cigarettes.  She has a 45.00 pack-year smoking history. She has never used smokeless tobacco. She reports that she does not drink alcohol or use drugs.  Allergies:  Allergies   Allergen Reactions  . Keflex [Cephalexin] Hives and Itching    No prescriptions prior to admission.    No results found for this or any previous visit (from the past 48 hour(s)). 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   There were no vitals taken for this visit. Physical Exam  Constitutional:  WD, WN, NAD HEENT:  NCAT, EOMI Neuro/Psych:  Alert & oriented to person, place, and time; appropriate mood & affect Lymphatic: No generalized UE edema or lymphadenopathy Extremities / MSK:  Both UE are normal with respect to appearance, ranges of motion, joint stability, muscle strength/tone, sensation, & perfusion except as otherwise noted:  Right upper extremity is in a long-arm splint.  Digits have good range of motion, radial pulse palpable, brisk capillary refill, neurologically intact to include AIN and PIN.  Labs / Xrays: No radiographic studies obtained today.  Previous x-rays are reviewed, revealing an "terrible triad", with posterior ulnohumeral dislocation, displaced coronoid fracture, and what appears to be a displaced radial head partial articular fracture  Assessment:  Right elbow fracture dislocation, presently dislocated, potentially 3+ weeks dislocated at this point  Plan:  I discussed today's findings with the patient and  reviewed the radiographs and used a plastic model in the explanation.  I reviewed the surgical goals of operative training and maintaining concentric reduction of the elbow joint, likely with repair of at least one set of collateral ligaments if not both.  This will likely also entail repair of the radial head, possibly wrist replacement, but I suspect just repair.  In addition, we may well plan to repair the coronoid, at least with anterior capsulodesis.  I indicated the grave nature of this injury and the difficulty of obtaining  long-term concentric reduction, stability, and motion.  In addition to repair internally of the structures as indicated, temporary joint stabilization will be achieved either with an internal device such as the skeletal dynamics-IJS or a hinged external fixator.  The details of the operative procedure were discussed with the patient.  Questions were invited and answered.  In addition to the goal of the procedure, the risks of the procedure to include but not limited to bleeding; infection; damage to the nerves or blood vessels that could result in bleeding, numbness, weakness, chronic pain, and the need for additional procedures; stiffness; the need for revision surgery; and anesthetic risks were reviewed.  No specific outcome was guaranteed or implied.  Informed consent was obtained.  We will plan to proceed tomorrow afternoon, likely as an outpatient. I indicated that the patient should contact her treating pain physician to arrive at an appropriate perioperative pain management regimen, taking responsibility for prescribing all narcotic analgesics.  We called the office to validate such request.  Marie Juarez, [name], MD 12/18/2016, 9:14 PM

## 2016-12-18 NOTE — Progress Notes (Signed)
Pt denies SOB, chest pain, and being under the care of a cardiologist. Pt denies having a stress test, echo and cardiac cath. Pt denies having an EKG and chest x ray within the last year. Pt denies having any recent labs. Pt made aware to stop taking  Aspirin ( already stopped) , vitamins, fish oil and herbal medications. Do not take any NSAIDs ie: Ibuprofen, Advil, Naproxen, BC and Goody Powder or any medication containing Aspirin such as Relafen. Pt verbalized understanding of all pre-op instructions.

## 2016-12-19 ENCOUNTER — Encounter (HOSPITAL_COMMUNITY)
Admission: RE | Disposition: A | Discharge status: Home / Self Care | Payer: Self-pay | Source: Ambulatory Visit | Attending: Orthopedic Surgery

## 2016-12-19 ENCOUNTER — Ambulatory Visit (HOSPITAL_COMMUNITY): Payer: Medicare Other

## 2016-12-19 ENCOUNTER — Ambulatory Visit (HOSPITAL_COMMUNITY)
Admission: RE | Admit: 2016-12-19 | Discharge: 2016-12-19 | Disposition: A | Discharge status: Home / Self Care | Payer: Medicare Other | Source: Ambulatory Visit | Attending: Orthopedic Surgery | Admitting: Orthopedic Surgery

## 2016-12-19 ENCOUNTER — Ambulatory Visit (HOSPITAL_COMMUNITY): Payer: Medicare Other | Admitting: Anesthesiology

## 2016-12-19 ENCOUNTER — Encounter (HOSPITAL_COMMUNITY): Payer: Self-pay | Admitting: *Deleted

## 2016-12-19 DIAGNOSIS — Z791 Long term (current) use of non-steroidal anti-inflammatories (NSAID): Secondary | ICD-10-CM | POA: Insufficient documentation

## 2016-12-19 DIAGNOSIS — W010XXA Fall on same level from slipping, tripping and stumbling without subsequent striking against object, initial encounter: Secondary | ICD-10-CM | POA: Insufficient documentation

## 2016-12-19 DIAGNOSIS — Z7982 Long term (current) use of aspirin: Secondary | ICD-10-CM | POA: Insufficient documentation

## 2016-12-19 DIAGNOSIS — E785 Hyperlipidemia, unspecified: Secondary | ICD-10-CM | POA: Diagnosis not present

## 2016-12-19 DIAGNOSIS — Z6834 Body mass index (BMI) 34.0-34.9, adult: Secondary | ICD-10-CM | POA: Insufficient documentation

## 2016-12-19 DIAGNOSIS — S52121A Displaced fracture of head of right radius, initial encounter for closed fracture: Secondary | ICD-10-CM | POA: Diagnosis not present

## 2016-12-19 DIAGNOSIS — Z79891 Long term (current) use of opiate analgesic: Secondary | ICD-10-CM | POA: Diagnosis not present

## 2016-12-19 DIAGNOSIS — I10 Essential (primary) hypertension: Secondary | ICD-10-CM | POA: Diagnosis not present

## 2016-12-19 DIAGNOSIS — F419 Anxiety disorder, unspecified: Secondary | ICD-10-CM | POA: Insufficient documentation

## 2016-12-19 DIAGNOSIS — E669 Obesity, unspecified: Secondary | ICD-10-CM | POA: Insufficient documentation

## 2016-12-19 DIAGNOSIS — S42401A Unspecified fracture of lower end of right humerus, initial encounter for closed fracture: Secondary | ICD-10-CM

## 2016-12-19 DIAGNOSIS — Z79899 Other long term (current) drug therapy: Secondary | ICD-10-CM | POA: Insufficient documentation

## 2016-12-19 DIAGNOSIS — F17210 Nicotine dependence, cigarettes, uncomplicated: Secondary | ICD-10-CM | POA: Insufficient documentation

## 2016-12-19 DIAGNOSIS — S53104A Unspecified dislocation of right ulnohumeral joint, initial encounter: Secondary | ICD-10-CM | POA: Diagnosis present

## 2016-12-19 DIAGNOSIS — S52041A Displaced fracture of coronoid process of right ulna, initial encounter for closed fracture: Secondary | ICD-10-CM | POA: Diagnosis not present

## 2016-12-19 HISTORY — DX: Edema, unspecified: R60.9

## 2016-12-19 HISTORY — DX: Gastro-esophageal reflux disease without esophagitis: K21.9

## 2016-12-19 HISTORY — DX: Unspecified fracture of lower end of right humerus, initial encounter for closed fracture: S42.401A

## 2016-12-19 HISTORY — PX: ORIF ELBOW FRACTURE: SHX5031

## 2016-12-19 LAB — CBC
HCT: 42.8 % (ref 36.0–46.0)
HEMOGLOBIN: 13.5 g/dL (ref 12.0–15.0)
MCH: 30.5 pg (ref 26.0–34.0)
MCHC: 31.5 g/dL (ref 30.0–36.0)
MCV: 96.8 fL (ref 78.0–100.0)
PLATELETS: 167 10*3/uL (ref 150–400)
RBC: 4.42 MIL/uL (ref 3.87–5.11)
RDW: 13.2 % (ref 11.5–15.5)
WBC: 7.9 10*3/uL (ref 4.0–10.5)

## 2016-12-19 LAB — BASIC METABOLIC PANEL
ANION GAP: 10 (ref 5–15)
BUN: 7 mg/dL (ref 6–20)
CALCIUM: 8.6 mg/dL — AB (ref 8.9–10.3)
CO2: 33 mmol/L — ABNORMAL HIGH (ref 22–32)
Chloride: 96 mmol/L — ABNORMAL LOW (ref 101–111)
Creatinine, Ser: 0.97 mg/dL (ref 0.44–1.00)
GLUCOSE: 113 mg/dL — AB (ref 65–99)
Potassium: 3.9 mmol/L (ref 3.5–5.1)
Sodium: 139 mmol/L (ref 135–145)

## 2016-12-19 SURGERY — OPEN REDUCTION INTERNAL FIXATION (ORIF) ELBOW/OLECRANON FRACTURE
Anesthesia: Regional | Site: Elbow | Laterality: Right

## 2016-12-19 MED ORDER — DEXAMETHASONE SODIUM PHOSPHATE 10 MG/ML IJ SOLN
INTRAMUSCULAR | Status: AC
  Filled 2016-12-19: qty 1

## 2016-12-19 MED ORDER — 0.9 % SODIUM CHLORIDE (POUR BTL) OPTIME
TOPICAL | Status: DC | PRN
  Administered 2016-12-19: 1000 mL

## 2016-12-19 MED ORDER — ALBUTEROL SULFATE HFA 108 (90 BASE) MCG/ACT IN AERS
INHALATION_SPRAY | RESPIRATORY_TRACT | Status: DC | PRN
  Administered 2016-12-19: 4 via RESPIRATORY_TRACT

## 2016-12-19 MED ORDER — CLINDAMYCIN PHOSPHATE 900 MG/50ML IV SOLN
INTRAVENOUS | Status: AC
  Filled 2016-12-19: qty 50

## 2016-12-19 MED ORDER — MIDAZOLAM HCL 2 MG/2ML IJ SOLN
INTRAMUSCULAR | Status: AC
  Filled 2016-12-19: qty 2

## 2016-12-19 MED ORDER — FENTANYL CITRATE (PF) 100 MCG/2ML IJ SOLN
50.0000 ug | INTRAMUSCULAR | Status: DC | PRN
  Administered 2016-12-19: 50 ug via INTRAVENOUS

## 2016-12-19 MED ORDER — MIDAZOLAM HCL 2 MG/2ML IJ SOLN
INTRAMUSCULAR | Status: AC
  Administered 2016-12-19: 1 mg via INTRAVENOUS
  Filled 2016-12-19: qty 2

## 2016-12-19 MED ORDER — BUPIVACAINE-EPINEPHRINE (PF) 0.5% -1:200000 IJ SOLN
INTRAMUSCULAR | Status: DC | PRN
  Administered 2016-12-19: 30 mL via PERINEURAL

## 2016-12-19 MED ORDER — MIDAZOLAM HCL 2 MG/2ML IJ SOLN
INTRAMUSCULAR | Status: DC | PRN
  Administered 2016-12-19: 2 mg via INTRAVENOUS

## 2016-12-19 MED ORDER — CLINDAMYCIN PHOSPHATE 900 MG/50ML IV SOLN
900.0000 mg | INTRAVENOUS | Status: AC
  Administered 2016-12-19: 900 mg via INTRAVENOUS

## 2016-12-19 MED ORDER — BUPIVACAINE HCL (PF) 0.25 % IJ SOLN
INTRAMUSCULAR | Status: AC
  Filled 2016-12-19: qty 30

## 2016-12-19 MED ORDER — DEXAMETHASONE SODIUM PHOSPHATE 10 MG/ML IJ SOLN
INTRAMUSCULAR | Status: DC | PRN
  Administered 2016-12-19: 10 mg via INTRAVENOUS

## 2016-12-19 MED ORDER — SUGAMMADEX SODIUM 200 MG/2ML IV SOLN
INTRAVENOUS | Status: DC | PRN
  Administered 2016-12-19: 180 mg via INTRAVENOUS

## 2016-12-19 MED ORDER — ONDANSETRON HCL 4 MG/2ML IJ SOLN
INTRAMUSCULAR | Status: AC
  Filled 2016-12-19: qty 2

## 2016-12-19 MED ORDER — FENTANYL CITRATE (PF) 100 MCG/2ML IJ SOLN
INTRAMUSCULAR | Status: AC
  Administered 2016-12-19: 50 ug via INTRAVENOUS
  Filled 2016-12-19: qty 2

## 2016-12-19 MED ORDER — LIDOCAINE 2% (20 MG/ML) 5 ML SYRINGE
INTRAMUSCULAR | Status: DC | PRN
  Administered 2016-12-19: 80 mg via INTRAVENOUS

## 2016-12-19 MED ORDER — LIDOCAINE HCL (PF) 1 % IJ SOLN
INTRAMUSCULAR | Status: AC
  Filled 2016-12-19: qty 30

## 2016-12-19 MED ORDER — MIDAZOLAM HCL 2 MG/2ML IJ SOLN
1.0000 mg | INTRAMUSCULAR | Status: DC | PRN
  Administered 2016-12-19: 1 mg via INTRAVENOUS

## 2016-12-19 MED ORDER — FENTANYL CITRATE (PF) 100 MCG/2ML IJ SOLN
25.0000 ug | INTRAMUSCULAR | Status: DC | PRN
  Administered 2016-12-19 (×2): 25 ug via INTRAVENOUS

## 2016-12-19 MED ORDER — METOCLOPRAMIDE HCL 5 MG/ML IJ SOLN: 10.0000 mg | Freq: Once | INTRAMUSCULAR | Status: DC | PRN

## 2016-12-19 MED ORDER — ALBUTEROL SULFATE HFA 108 (90 BASE) MCG/ACT IN AERS
INHALATION_SPRAY | RESPIRATORY_TRACT | Status: AC
  Filled 2016-12-19: qty 6.7

## 2016-12-19 MED ORDER — FENTANYL CITRATE (PF) 100 MCG/2ML IJ SOLN
INTRAMUSCULAR | Status: AC
  Filled 2016-12-19: qty 4

## 2016-12-19 MED ORDER — FENTANYL CITRATE (PF) 100 MCG/2ML IJ SOLN
INTRAMUSCULAR | Status: AC
  Filled 2016-12-19: qty 2

## 2016-12-19 MED ORDER — FENTANYL CITRATE (PF) 100 MCG/2ML IJ SOLN
INTRAMUSCULAR | Status: DC | PRN
  Administered 2016-12-19 (×4): 50 ug via INTRAVENOUS

## 2016-12-19 MED ORDER — ALBUTEROL SULFATE (2.5 MG/3ML) 0.083% IN NEBU
INHALATION_SOLUTION | RESPIRATORY_TRACT | Status: AC
  Administered 2016-12-19: 2.5 mg
  Filled 2016-12-19: qty 3

## 2016-12-19 MED ORDER — MEPERIDINE HCL 25 MG/ML IJ SOLN: 6.2500 mg | INTRAMUSCULAR | Status: DC | PRN

## 2016-12-19 MED ORDER — PROPOFOL 10 MG/ML IV BOLUS
INTRAVENOUS | Status: AC
  Filled 2016-12-19: qty 20

## 2016-12-19 MED ORDER — ONDANSETRON HCL 4 MG/2ML IJ SOLN
INTRAMUSCULAR | Status: DC | PRN
  Administered 2016-12-19: 4 mg via INTRAVENOUS

## 2016-12-19 MED ORDER — PROPOFOL 10 MG/ML IV BOLUS
INTRAVENOUS | Status: DC | PRN
  Administered 2016-12-19: 120 mg via INTRAVENOUS
  Administered 2016-12-19: 20 mg via INTRAVENOUS

## 2016-12-19 MED ORDER — SUGAMMADEX SODIUM 200 MG/2ML IV SOLN
INTRAVENOUS | Status: AC
  Filled 2016-12-19: qty 2

## 2016-12-19 MED ORDER — ROCURONIUM BROMIDE 100 MG/10ML IV SOLN
INTRAVENOUS | Status: DC | PRN
  Administered 2016-12-19: 50 mg via INTRAVENOUS

## 2016-12-19 MED ORDER — NAPROXEN 500 MG PO TABS: 500.0000 mg | ORAL_TABLET | Freq: Two times a day (BID) | ORAL | 0 refills | Status: AC

## 2016-12-19 MED ORDER — OXYCODONE HCL 5 MG PO TABS: 5.0000 mg | ORAL_TABLET | Freq: Four times a day (QID) | ORAL | 0 refills | Status: DC | PRN

## 2016-12-19 MED ORDER — LACTATED RINGERS IV SOLN
INTRAVENOUS | Status: DC
  Administered 2016-12-19: 13:00:00 via INTRAVENOUS

## 2016-12-19 SURGICAL SUPPLY — 87 items
ANCH SUT 1 SHRT SM RGD INSRTR (Anchor) ×1 IMPLANT
ANCHOR SUT 1.45 SZ 1 SHORT (Anchor) ×2 IMPLANT
BANDAGE ACE 4X5 VEL STRL LF (GAUZE/BANDAGES/DRESSINGS) ×2 IMPLANT
BIT DRILL 1.8 CANN MAX VPC (BIT) ×2 IMPLANT
BIT DRILL CANN DSTL CUT.7X70MM (DRILL) IMPLANT
BIT DRILL SLD SIDE CUT 2.5X80 (DRILL) IMPLANT
BLADE LONG MED 31MMX9MM (MISCELLANEOUS) ×1
BLADE LONG MED 31X9 (MISCELLANEOUS) ×1 IMPLANT
BLADE MINI RND TIP GREEN BEAV (BLADE) IMPLANT
BNDG CMPR 9X4 STRL LF SNTH (GAUZE/BANDAGES/DRESSINGS) ×1
BNDG COHESIVE 4X5 TAN STRL (GAUZE/BANDAGES/DRESSINGS) ×2 IMPLANT
BNDG ESMARK 4X9 LF (GAUZE/BANDAGES/DRESSINGS) ×3 IMPLANT
BNDG GAUZE ELAST 4 BULKY (GAUZE/BANDAGES/DRESSINGS) ×2 IMPLANT
CLOSURE WOUND 1/2 X4 (GAUZE/BANDAGES/DRESSINGS) ×1
CORDS BIPOLAR (ELECTRODE) ×3 IMPLANT
COVER MAYO STAND STRL (DRAPES) ×2 IMPLANT
COVER SURGICAL LIGHT HANDLE (MISCELLANEOUS) ×3 IMPLANT
CUFF TOURNIQUET SINGLE 18IN (TOURNIQUET CUFF) IMPLANT
CUFF TOURNIQUET SINGLE 24IN (TOURNIQUET CUFF) ×2 IMPLANT
DRAPE C-ARM 42X72 X-RAY (DRAPES) ×2 IMPLANT
DRAPE OEC MINIVIEW 54X84 (DRAPES) IMPLANT
DRAPE SURG 17X23 STRL (DRAPES) ×3 IMPLANT
DRILL CANN DISTAL CUT 2.7X70MM (DRILL) ×2
DRILL SOLID SIDE CUT 2.5X80MM (DRILL) ×3
DRSG ADAPTIC 3X8 NADH LF (GAUZE/BANDAGES/DRESSINGS) ×2 IMPLANT
ELECT NDL TIP 2.8 STRL (NEEDLE) IMPLANT
ELECT NEEDLE TIP 2.8 STRL (NEEDLE) IMPLANT
GAUZE SPONGE 4X4 12PLY STRL (GAUZE/BANDAGES/DRESSINGS) ×2 IMPLANT
GAUZE XEROFORM 1X8 LF (GAUZE/BANDAGES/DRESSINGS) IMPLANT
GAUZE XEROFORM 5X9 LF (GAUZE/BANDAGES/DRESSINGS) ×2 IMPLANT
GLOVE SURG SYN 8.0 (GLOVE) ×3 IMPLANT
GLOVE SURG SYN 8.0 PF PI (GLOVE) ×1 IMPLANT
GOWN STRL REUS W/ TWL LRG LVL3 (GOWN DISPOSABLE) ×1 IMPLANT
GOWN STRL REUS W/ TWL XL LVL3 (GOWN DISPOSABLE) ×1 IMPLANT
GOWN STRL REUS W/TWL LRG LVL3 (GOWN DISPOSABLE) ×9
GOWN STRL REUS W/TWL XL LVL3 (GOWN DISPOSABLE) ×3
IJS-E-Base Plate  Assembly ×2 IMPLANT
IMPL HEAD (Orthopedic Implant) IMPLANT
IMPL STEM W/SCREW 7X26MM (Stem) IMPLANT
IMPLANT HEAD (Orthopedic Implant) ×3 IMPLANT
IMPLANT STEM W/SCREW 7X26MM (Stem) ×3 IMPLANT
K-WIRE COCR 0.9X95 (WIRE) ×9
K-WIRE STD TIP 1.5X127 (WIRE) ×9
KIT BASIN OR (CUSTOM PROCEDURE TRAY) ×3 IMPLANT
KIT ROOM TURNOVER OR (KITS) ×3 IMPLANT
KWIRE COCR 0.9X95 (WIRE) IMPLANT
KWIRE STD TIP 1.5X127 (WIRE) IMPLANT
LOOP VESSEL MAXI BLUE (MISCELLANEOUS) IMPLANT
MANIFOLD NEPTUNE II (INSTRUMENTS) ×3 IMPLANT
NDL HYPO 25GX1X1/2 BEV (NEEDLE) IMPLANT
NEEDLE HYPO 25GX1X1/2 BEV (NEEDLE) IMPLANT
NS IRRIG 1000ML POUR BTL (IV SOLUTION) ×3 IMPLANT
PACK ORTHO EXTREMITY (CUSTOM PROCEDURE TRAY) ×3 IMPLANT
PAD ARMBOARD 7.5X6 YLW CONV (MISCELLANEOUS) ×6 IMPLANT
PAD CAST 4YDX4 CTTN HI CHSV (CAST SUPPLIES) IMPLANT
PADDING CAST COTTON 4X4 STRL (CAST SUPPLIES) ×3
PENCIL BUTTON HOLSTER BLD 10FT (ELECTRODE) IMPLANT
PIN AXIS 2.5X45MM (PIN) ×2 IMPLANT
RETRIEVER SUT HEWSON (MISCELLANEOUS) ×2 IMPLANT
SCREW POLYAXIAL NL 3.5X20 (Screw) ×1 IMPLANT
SCREW POLYAXIAL NL 3.5X24 (Screw) ×2 IMPLANT
SCREW POLYAXIAL NL 3.5X26 (Screw) ×2 IMPLANT
SCREW VPC 2.5X16MM (Screw) ×4 IMPLANT
SLING ARM FOAM STRAP LRG (SOFTGOODS) ×2 IMPLANT
SOLUTION BETADINE 4OZ (MISCELLANEOUS) ×1 IMPLANT
SPLINT FIBERGLASS 3X35 (CAST SUPPLIES) ×2 IMPLANT
SPONGE LAP 4X18 X RAY DECT (DISPOSABLE) ×2 IMPLANT
SPONGE SCRUB IODOPHOR (GAUZE/BANDAGES/DRESSINGS) ×1 IMPLANT
STAPLER VISISTAT 35W (STAPLE) ×2 IMPLANT
STRIP CLOSURE SKIN 1/2X4 (GAUZE/BANDAGES/DRESSINGS) ×1 IMPLANT
SUCTION FRAZIER HANDLE 10FR (MISCELLANEOUS) ×2
SUCTION TUBE FRAZIER 10FR DISP (MISCELLANEOUS) IMPLANT
SUT MAXBRAID (SUTURE) ×2 IMPLANT
SUT PROLENE 3 0 PS 2 (SUTURE) IMPLANT
SUT VIC AB 0 CT1 27 (SUTURE) ×3
SUT VIC AB 0 CT1 27XBRD ANBCTR (SUTURE) IMPLANT
SUT VIC AB 2-0 CT1 27 (SUTURE) ×3
SUT VIC AB 2-0 CT1 TAPERPNT 27 (SUTURE) IMPLANT
SUT VIC AB 2-0 FS1 27 (SUTURE) IMPLANT
SYR BULB 3OZ (MISCELLANEOUS) ×1 IMPLANT
SYR CONTROL 10ML LL (SYRINGE) IMPLANT
TOWEL OR 17X24 6PK STRL BLUE (TOWEL DISPOSABLE) ×1 IMPLANT
TOWEL OR 17X26 10 PK STRL BLUE (TOWEL DISPOSABLE) ×2 IMPLANT
TUBE CONNECTING 12'X1/4 (SUCTIONS) ×1
TUBE CONNECTING 12X1/4 (SUCTIONS) ×1 IMPLANT
UNDERPAD 30X30 (UNDERPADS AND DIAPERS) ×3 IMPLANT
WATER STERILE IRR 1000ML POUR (IV SOLUTION) ×1 IMPLANT

## 2016-12-19 NOTE — Interval H&P Note (Signed)
History and Physical Interval Note:  12/19/2016 3:01 PM  Marie Juarez  has presented today for surgery, with the diagnosis of RIGHT ELBOW FRACTURE-DISLOCATION S52.121A, S53.121A  The various methods of treatment have been discussed with the patient and family. After consideration of risks, benefits and other options for treatment, the patient has consented to  Procedure(s) with comments: OPEN TREATMENT OF RIGHT ELBOW-DISLOCATION FRACTURE (Right) - GENERAL ANESTHESIA WITH PRE-OP BLOCK as a surgical intervention .  The patient's history has been reviewed, patient examined, no change in status, stable for surgery.  I have reviewed the patient's chart and labs.  Questions were answered to the patient's satisfaction.     Brailynn Breth A.

## 2016-12-19 NOTE — Transfer of Care (Signed)
Immediate Anesthesia Transfer of Care Note  Patient: Marie Juarez  Procedure(s) Performed: Procedure(s) with comments: OPEN TREATMENT OF RIGHT ELBOW-DISLOCATION FRACTURE (Right) - GENERAL ANESTHESIA WITH PRE-OP BLOCK  Patient Location: PACU  Anesthesia Type:General  Level of Consciousness: awake, alert  and oriented  Airway & Oxygen Therapy: Patient Spontanous Breathing and Patient connected to nasal cannula oxygen  Post-op Assessment: Report given to RN, Post -op Vital signs reviewed and stable and Patient moving all extremities  Post vital signs: Reviewed and stable  Last Vitals:  Vitals:   12/19/16 1315 12/19/16 1750  BP: (!) 117/58 (!) 145/82  Pulse: 91   Resp: 16 (!) 21  Temp:  36.5 C    Last Pain:  Vitals:   12/19/16 1750  TempSrc:   PainSc: 0-No pain      Patients Stated Pain Goal: 8 (12/19/16 1241)  Complications: No apparent anesthesia complications block working well, sat still 90-92% on nasal cannula (same in preop). Denies SOB

## 2016-12-19 NOTE — Anesthesia Preprocedure Evaluation (Addendum)
Anesthesia Evaluation  Patient identified by MRN, date of birth, ID band Patient awake    Reviewed: Allergy & Precautions, NPO status , Patient's Chart, lab work & pertinent test results  Airway Mallampati: III  TM Distance: >3 FB Neck ROM: Full    Dental no notable dental hx. (+) Poor Dentition, Chipped, Missing   Pulmonary shortness of breath, Current Smoker,  Snores- probable OSA undiagnosed   Pulmonary exam normal breath sounds clear to auscultation       Cardiovascular hypertension, Pt. on medications Normal cardiovascular exam Rhythm:Regular Rate:Normal     Neuro/Psych Anxiety    GI/Hepatic Neg liver ROS, GERD  ,  Endo/Other  Obesity Hyperlipidemia  Renal/GU negative Renal ROS  negative genitourinary   Musculoskeletal Fx dislocation right elbow   Abdominal (+) + obese,   Peds  Hematology   Anesthesia Other Findings   Reproductive/Obstetrics                            Lab Results  Component Value Date   WBC 7.9 12/19/2016   HGB 13.5 12/19/2016   HCT 42.8 12/19/2016   MCV 96.8 12/19/2016   PLT 167 12/19/2016   EKG: normal sinus rhythm, baseline artifact.  Anesthesia Physical Anesthesia Plan  ASA: II  Anesthesia Plan: General and Regional   Post-op Pain Management:  Regional for Post-op pain   Induction: Intravenous  Airway Management Planned: Oral ETT  Additional Equipment:   Intra-op Plan:   Post-operative Plan: Extubation in OR  Informed Consent: I have reviewed the patients History and Physical, chart, labs and discussed the procedure including the risks, benefits and alternatives for the proposed anesthesia with the patient or authorized representative who has indicated his/her understanding and acceptance.   Dental advisory given  Plan Discussed with: CRNA, Anesthesiologist and Surgeon  Anesthesia Plan Comments:         Anesthesia Quick Evaluation

## 2016-12-19 NOTE — Anesthesia Procedure Notes (Addendum)
Anesthesia Regional Block:  Supraclavicular block  Pre-Anesthetic Checklist: ,, timeout performed, Correct Patient, Correct Site, Correct Laterality, Correct Procedure, Correct Position, site marked, Risks and benefits discussed,  Surgical consent,  Pre-op evaluation,  At surgeon's request and post-op pain management  Laterality: Right  Prep: chloraprep       Needles:  Injection technique: Single-shot  Needle Type: Echogenic Stimulator Needle     Needle Length: 9cm 9 cm Needle Gauge: 21 and 21 G  Needle insertion depth: 5 cm   Additional Needles:  Procedures: ultrasound guided (picture in chart) Supraclavicular block Narrative:  Start time: 12/19/2016 1:08 PM End time: 12/19/2016 1:15 PM Injection made incrementally with aspirations every 5 mL.  Performed by: Personally  Anesthesiologist: Mal AmabileFOSTER, Trevaris Pennella  Additional Notes: Timeout performed. Patient sedated. Relevant anatomy ID'd with US. Incremental 5ml injection with frequent aspiration. Patient tolerated procedure well.

## 2016-12-19 NOTE — Anesthesia Procedure Notes (Addendum)
Procedure Name: Intubation Date/Time: 12/19/2016 3:13 PM Performed by: Orvilla FusATO, Riana Tessmer A Pre-anesthesia Checklist: Patient identified, Emergency Drugs available, Suction available and Patient being monitored Patient Re-evaluated:Patient Re-evaluated prior to inductionOxygen Delivery Method: Circle System Utilized Preoxygenation: Pre-oxygenation with 100% oxygen Intubation Type: IV induction Ventilation: Mask ventilation without difficulty and Oral airway inserted - appropriate to patient size Laryngoscope Size: Glidescope and 3 Grade View: Grade I Tube type: Oral Tube size: 7.0 mm Number of attempts: 1 Airway Equipment and Method: Oral airway,  Video-laryngoscopy and Rigid stylet Placement Confirmation: ETT inserted through vocal cords under direct vision,  positive ETCO2 and breath sounds checked- equal and bilateral Secured at: 20 cm Tube secured with: Tape Dental Injury: Teeth and Oropharynx as per pre-operative assessment  Comments: Mask ventilation easy with oral airway. High pressures encountered (PIP 35) during mask ventilation possibly related to heavy smoking/COPD. Ventilation improved with onset of inhalation agent. Pre-induction O2 sat 90-91% on 4L nasal cannula. Pt c/o SOB after moving self to OR table. Unable to tolerate lying flat. PreO2 with tight fitting facemask and head elevated for 5 min. - tolerated apnea period well. Glidescope used without issue (due to poor dentition).  Stomach emptied with OGT post-intubation.

## 2016-12-19 NOTE — Op Note (Signed)
12/19/2016  3:01 PM  PATIENT:  Marie Juarez  61 y.o. 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  Open treatment of right elbow chronic dislocation    2.  Radial head excision and replacement with prosthetic implant    3.  Right elbow lateral collateral ligament repair  SURGEON: Cliffton Asters. [name], MD  PHYSICIAN ASSISTANT: Danielle Rankin, OPA-C  ANESTHESIA:  regional and general  SPECIMENS:  None  DRAINS:   None  EBL:  less than 50 mL  PREOPERATIVE INDICATIONS:  Marie Juarez is a  61 y.o. female with a 41-week-old right elbow fracture dislocation, including fractures of the radial head and coronoid.  The risks benefits and alternatives were discussed with the patient preoperatively including but not limited to the risks of infection, bleeding, nerve injury, cardiopulmonary complications, the need for revision surgery, among others, and the patient verbalized understanding and consented to proceed.  OPERATIVE IMPLANTS:  1.  Skeletal dynamics IJS implant     2. Biomet radial head replacement--7 mm stem, 10 x 22mm head.     3. Juggerknot suture anchor       OPERATIVE PROCEDURE:  After receiving prophylactic antibiotics and a regional block, the patient was escorted to the operative theatre and placed in a supine position.  General anesthesia was administered.  A surgical "time-out" was performed during which the planned procedure, proposed operative site, and the correct patient identity were compared to the operative consent and agreement confirmed by the circulating nurse according to current facility policy.  The exposed skin was pre-scrub with a Hibiclens scrub brush before being formally prepped with Chloraprep and draped in the usual sterile fashion.  The limb was exsanguinated with an Esmarch bandage and the tourniquet inflated to approximately higher than systolic  BP.  A straight posterior approach was made sharply with a scalpel, subcutaneous tissues elevated in a full-thickness flap off the deep fascia.  The flap was elevated enough to gain access to the lateral aspect of the elbow.  Soft tissue sleeve was sharply dissected off the lateral supracondylar flare, down to the lateral epicondyle, exploiting the interval between the common extensor and the ACU.  As this full-thickness flap was elevated, the annular ligament was divided and the supinator bluntly spread to allow for elevation of the capsule off the neck of the radius.  With the joint thus exposed, it was irrigated and the radial head fragment found lying posterior to the radial head.  In addition, coronoid fracture fragment was identified as well and it was adherent to some anterior soft tissues.  Decision was made to proceed with fixation of the coronoid fragment with suture fixation.  A K wire was driven through the medial and lateral raw posterior surfaces of the coronoid to allow for passage of a #2 max braid suture.  Matching tunnels were made with K wires to the raw surface of the ulna where the fracture of the coronoid had occurred, and these suture ends were brought through thus passing them completely through the ulna to be tied posteriorly.  They were not yet tied.  Attention was then shifted to the radial head where a fracture fragment of about a third of the head was encountered, but it was thin.  It was not wedge-shaped transverse.  It was fixed with 2 different small headless cannulated screws, but as the remainder of the case progressed, the piece fell  apart and was removed.  Screws were removed and decision made to proceed with radial head replacement.  The guidepin for the IJ yes was then placed and overdrilled so that the spindle within the distal humerus could be placed.  It was provisionally placed there and a juggernaut suture anchor placed just beside it, at the lateral epicondyles.  Lateral  capsule ligamentous tissue was then incorporated into the repair not yet tightened.  The radial head was removed by cutting the neck.  Ultimately another 1-2 mm was resected to allow for the radial head/neck to fit better.  The head was sized to be most matching of the median head.  This 22 mm head was trialed after the canal was broached and the trial stem placed.  It seemed to have good fit and fill, without overstuffing the lateral side.  The wound was irrigated and the actual components placed.  The wound was again copiously irrigated and reduced.  Images were obtained.  The ulnar component of the IJS was then positioned and placed with 3 screws, over the anconeus.  With the joint reduced, the IJS was tightened. In this manner, the coronoid was reduced and secured, tying over a bone bridge in the posterior lateral aspect of the proximal ulna.  The lateral ligament repair was then completed.  Final images were obtained and the joint was found to be stable throughout his range of motion.  Range was easily from 20-120, with full smooth and fluid pronation and supination.  0 Vicryl suture was used to repair the soft tissues to include loose reapproximation of the annular ligament.  Tourniquet was released additional hemostasis and necessary and the skin was closed with accommodation 0 Vicryl and 2-0 Vicryl deep dermal buried sutures and staples in the skin.  A long-arm splint dressing with fiberglass component was applied, with the elbow at 90 and the forearm pronated, and she was awakened and taken to recovery room in stable condition, breathing spontaneously  DISPOSITION: She'll be discharged home today with typical instructions, returning in 10-15 days with new x-rays of the right elbow in the splint and likely transitioning to just sling immobilization of movement, possibly adding formal therapy at that time.

## 2016-12-19 NOTE — Discharge Instructions (Signed)
Discharge Instructions   You have a dressing with a plaster splint incorporated in it. Move your fingers as much as possible, making a full fist and fully opening the fist. Elevate your hand above your elbow to reduce pain & swelling of the digits.  Ice over the operative site or in the arm pit may be helpful to reduce pain & swelling.  DO NOT USE HEAT. Pain medicine has been prescribed for you.  Use your medicine as needed over the first 48 hours, and then you can begin to taper your use.  You may use Tylenol in place of your prescribed pain medication, but not IN ADDITION to it.  Leave the dressing in place until you return to our office and utilize the sling. You may shower, but keep the bandage clean & dry.  You may drive a car when you are off of prescription pain medications and can safely control your vehicle with both hands. Call our office to arrange a follow up appointment for 10-15 days from the date of surgery.   Please call [phone] during normal business hours or [phone] after hours for any problems. Including the following:  - excessive redness of the incisions - drainage for more than 4 days - fever of more than 101.5 F  *Please note that pain medications will not be refilled after hours or on weekends.

## 2016-12-25 ENCOUNTER — Encounter (HOSPITAL_COMMUNITY): Payer: Self-pay | Admitting: Orthopedic Surgery

## 2016-12-25 NOTE — Anesthesia Postprocedure Evaluation (Addendum)
Anesthesia Post Note  Patient: Mliss FritzBarbara T Sidener  Procedure(s) Performed: Procedure(s) (LRB): OPEN TREATMENT OF RIGHT ELBOW-DISLOCATION FRACTURE (Right)  Patient location during evaluation: PACU Anesthesia Type: Regional Level of consciousness: awake Pain management: pain level controlled Vital Signs Assessment: post-procedure vital signs reviewed and stable Respiratory status: spontaneous breathing Cardiovascular status: stable Postop Assessment: no signs of nausea or vomiting Anesthetic complications: no       Last Vitals:  Vitals:   12/19/16 1908 12/19/16 1918  BP: (!) 141/77 (!) 146/77  Pulse: 95 92  Resp: (!) 21 13  Temp:  36.4 C    Last Pain:  Vitals:   12/19/16 1908  TempSrc:   PainSc: Asleep                 Daven Montz

## 2017-02-06 ENCOUNTER — Inpatient Hospital Stay (HOSPITAL_COMMUNITY): Payer: Medicare Other | Admitting: Anesthesiology

## 2017-02-06 ENCOUNTER — Encounter (HOSPITAL_COMMUNITY): Payer: Self-pay | Admitting: General Practice

## 2017-02-06 ENCOUNTER — Encounter (HOSPITAL_COMMUNITY)
Admission: AD | Disposition: A | Discharge status: Home Health | Payer: Self-pay | Source: Ambulatory Visit | Attending: Orthopedic Surgery

## 2017-02-06 ENCOUNTER — Inpatient Hospital Stay (HOSPITAL_COMMUNITY)
Admission: AD | Admit: 2017-02-06 | Discharge: 2017-02-09 | DRG: 858 | Disposition: A | Discharge status: Home Health | Payer: Medicare Other | Source: Ambulatory Visit | Attending: Orthopedic Surgery | Admitting: Orthopedic Surgery

## 2017-02-06 DIAGNOSIS — T814XXA Infection following a procedure, initial encounter: Principal | ICD-10-CM | POA: Diagnosis present

## 2017-02-06 DIAGNOSIS — I10 Essential (primary) hypertension: Secondary | ICD-10-CM | POA: Diagnosis present

## 2017-02-06 DIAGNOSIS — E669 Obesity, unspecified: Secondary | ICD-10-CM | POA: Diagnosis present

## 2017-02-06 DIAGNOSIS — W1830XA Fall on same level, unspecified, initial encounter: Secondary | ICD-10-CM | POA: Diagnosis present

## 2017-02-06 DIAGNOSIS — Z8781 Personal history of (healed) traumatic fracture: Secondary | ICD-10-CM | POA: Diagnosis not present

## 2017-02-06 DIAGNOSIS — M160 Bilateral primary osteoarthritis of hip: Secondary | ICD-10-CM | POA: Diagnosis present

## 2017-02-06 DIAGNOSIS — Z6838 Body mass index (BMI) 38.0-38.9, adult: Secondary | ICD-10-CM | POA: Diagnosis not present

## 2017-02-06 DIAGNOSIS — Z823 Family history of stroke: Secondary | ICD-10-CM

## 2017-02-06 DIAGNOSIS — L089 Local infection of the skin and subcutaneous tissue, unspecified: Secondary | ICD-10-CM | POA: Diagnosis present

## 2017-02-06 DIAGNOSIS — Z833 Family history of diabetes mellitus: Secondary | ICD-10-CM | POA: Diagnosis not present

## 2017-02-06 DIAGNOSIS — F419 Anxiety disorder, unspecified: Secondary | ICD-10-CM | POA: Diagnosis present

## 2017-02-06 DIAGNOSIS — B9689 Other specified bacterial agents as the cause of diseases classified elsewhere: Secondary | ICD-10-CM | POA: Diagnosis not present

## 2017-02-06 DIAGNOSIS — Z881 Allergy status to other antibiotic agents status: Secondary | ICD-10-CM

## 2017-02-06 DIAGNOSIS — T84619D Infection and inflammatory reaction due to internal fixation device of unspecified bone of arm, subsequent encounter: Secondary | ICD-10-CM | POA: Diagnosis not present

## 2017-02-06 DIAGNOSIS — T8149XA Infection following a procedure, other surgical site, initial encounter: Secondary | ICD-10-CM | POA: Diagnosis present

## 2017-02-06 DIAGNOSIS — Z8249 Family history of ischemic heart disease and other diseases of the circulatory system: Secondary | ICD-10-CM | POA: Diagnosis not present

## 2017-02-06 DIAGNOSIS — E785 Hyperlipidemia, unspecified: Secondary | ICD-10-CM | POA: Diagnosis present

## 2017-02-06 DIAGNOSIS — M5186 Other intervertebral disc disorders, lumbar region: Secondary | ICD-10-CM | POA: Diagnosis present

## 2017-02-06 DIAGNOSIS — Y792 Prosthetic and other implants, materials and accessory orthopedic devices associated with adverse incidents: Secondary | ICD-10-CM | POA: Diagnosis not present

## 2017-02-06 DIAGNOSIS — L0889 Other specified local infections of the skin and subcutaneous tissue: Secondary | ICD-10-CM | POA: Diagnosis not present

## 2017-02-06 DIAGNOSIS — M170 Bilateral primary osteoarthritis of knee: Secondary | ICD-10-CM | POA: Diagnosis present

## 2017-02-06 DIAGNOSIS — K219 Gastro-esophageal reflux disease without esophagitis: Secondary | ICD-10-CM | POA: Diagnosis present

## 2017-02-06 DIAGNOSIS — S50311A Abrasion of right elbow, initial encounter: Secondary | ICD-10-CM | POA: Diagnosis present

## 2017-02-06 DIAGNOSIS — T8140XA Infection following a procedure, unspecified, initial encounter: Secondary | ICD-10-CM | POA: Diagnosis present

## 2017-02-06 DIAGNOSIS — F17210 Nicotine dependence, cigarettes, uncomplicated: Secondary | ICD-10-CM | POA: Diagnosis present

## 2017-02-06 HISTORY — DX: Low back pain: M54.5

## 2017-02-06 HISTORY — DX: Shortness of breath: R06.02

## 2017-02-06 HISTORY — DX: Low back pain, unspecified: M54.50

## 2017-02-06 HISTORY — DX: Other intervertebral disc degeneration, lumbar region without mention of lumbar back pain or lower extremity pain: M51.369

## 2017-02-06 HISTORY — PX: I & D EXTREMITY: SHX5045

## 2017-02-06 HISTORY — DX: Other intervertebral disc displacement, lumbar region: M51.26

## 2017-02-06 HISTORY — DX: Other chronic pain: G89.29

## 2017-02-06 HISTORY — DX: Other intervertebral disc degeneration, lumbar region: M51.36

## 2017-02-06 HISTORY — DX: Unspecified osteoarthritis, unspecified site: M19.90

## 2017-02-06 LAB — CBC WITH DIFFERENTIAL/PLATELET
BASOS PCT: 0 %
Basophils Absolute: 0 10*3/uL (ref 0.0–0.1)
Eosinophils Absolute: 0.1 10*3/uL (ref 0.0–0.7)
Eosinophils Relative: 1 %
HEMATOCRIT: 45.5 % (ref 36.0–46.0)
HEMOGLOBIN: 13.8 g/dL (ref 12.0–15.0)
LYMPHS ABS: 1.5 10*3/uL (ref 0.7–4.0)
LYMPHS PCT: 15 %
MCH: 27.1 pg (ref 26.0–34.0)
MCHC: 30.3 g/dL (ref 30.0–36.0)
MCV: 89.4 fL (ref 78.0–100.0)
MONOS PCT: 5 %
Monocytes Absolute: 0.5 10*3/uL (ref 0.1–1.0)
NEUTROS ABS: 7.9 10*3/uL — AB (ref 1.7–7.7)
NEUTROS PCT: 79 %
Platelets: 183 10*3/uL (ref 150–400)
RBC: 5.09 MIL/uL (ref 3.87–5.11)
RDW: 15.8 % — ABNORMAL HIGH (ref 11.5–15.5)
WBC: 10 10*3/uL (ref 4.0–10.5)

## 2017-02-06 LAB — BASIC METABOLIC PANEL
Anion gap: 11 (ref 5–15)
BUN: 5 mg/dL — ABNORMAL LOW (ref 6–20)
CHLORIDE: 97 mmol/L — AB (ref 101–111)
CO2: 31 mmol/L (ref 22–32)
Calcium: 8.5 mg/dL — ABNORMAL LOW (ref 8.9–10.3)
Creatinine, Ser: 0.81 mg/dL (ref 0.44–1.00)
GFR calc non Af Amer: >60 mL/min (ref >60)
Glucose, Bld: 92 mg/dL (ref 65–99)
Potassium: 4.3 mmol/L (ref 3.5–5.1)
Sodium: 139 mmol/L (ref 135–145)

## 2017-02-06 LAB — SURGICAL PCR SCREEN
MRSA, PCR: NEGATIVE
Staphylococcus aureus: NEGATIVE

## 2017-02-06 LAB — C-REACTIVE PROTEIN: CRP: 1.5 mg/dL — AB (ref <1.0)

## 2017-02-06 LAB — SEDIMENTATION RATE: Sed Rate: 2 mm/hr (ref 0–22)

## 2017-02-06 SURGERY — IRRIGATION AND DEBRIDEMENT EXTREMITY
Anesthesia: General | Site: Elbow | Laterality: Right

## 2017-02-06 MED ORDER — FENTANYL CITRATE (PF) 100 MCG/2ML IJ SOLN
INTRAMUSCULAR | Status: DC | PRN
  Administered 2017-02-06 (×2): 50 ug via INTRAVENOUS

## 2017-02-06 MED ORDER — MIDAZOLAM HCL 5 MG/5ML IJ SOLN
INTRAMUSCULAR | Status: DC | PRN
  Administered 2017-02-06: 2 mg via INTRAVENOUS

## 2017-02-06 MED ORDER — HEPARIN SODIUM (PORCINE) 5000 UNIT/ML IJ SOLN
5000.0000 [IU] | Freq: Three times a day (TID) | INTRAMUSCULAR | Status: DC
  Administered 2017-02-07 – 2017-02-09 (×7): 5000 [IU] via SUBCUTANEOUS
  Filled 2017-02-06 (×9): qty 1

## 2017-02-06 MED ORDER — MORPHINE SULFATE (PF) 2 MG/ML IV SOLN
2.0000 mg | INTRAVENOUS | Status: DC | PRN
  Administered 2017-02-07: 2 mg via INTRAVENOUS
  Filled 2017-02-06: qty 1

## 2017-02-06 MED ORDER — LIDOCAINE 2% (20 MG/ML) 5 ML SYRINGE
INTRAMUSCULAR | Status: DC | PRN
  Administered 2017-02-06: 100 mg via INTRAVENOUS

## 2017-02-06 MED ORDER — CLINDAMYCIN PHOSPHATE 900 MG/50ML IV SOLN
INTRAVENOUS | Status: DC | PRN
  Administered 2017-02-06: 900 mg via INTRAVENOUS

## 2017-02-06 MED ORDER — KETAMINE HCL 100 MG/ML IJ SOLN
INTRAMUSCULAR | Status: AC
  Filled 2017-02-06: qty 1

## 2017-02-06 MED ORDER — MIDAZOLAM HCL 2 MG/2ML IJ SOLN
INTRAMUSCULAR | Status: AC
  Filled 2017-02-06: qty 2

## 2017-02-06 MED ORDER — ONDANSETRON HCL 4 MG/2ML IJ SOLN
INTRAMUSCULAR | Status: DC | PRN
  Administered 2017-02-06: 4 mg via INTRAVENOUS

## 2017-02-06 MED ORDER — FUROSEMIDE 20 MG PO TABS
20.0000 mg | ORAL_TABLET | Freq: Every day | ORAL | Status: DC
  Administered 2017-02-07 – 2017-02-09 (×3): 20 mg via ORAL
  Filled 2017-02-06 (×3): qty 1

## 2017-02-06 MED ORDER — MEPERIDINE HCL 25 MG/ML IJ SOLN: 6.2500 mg | INTRAMUSCULAR | Status: DC | PRN

## 2017-02-06 MED ORDER — SODIUM CHLORIDE 0.9 % IR SOLN
Status: DC | PRN
  Administered 2017-02-06: 3000 mL

## 2017-02-06 MED ORDER — HYDROMORPHONE HCL 1 MG/ML IJ SOLN
INTRAMUSCULAR | Status: AC
  Filled 2017-02-06: qty 0.5

## 2017-02-06 MED ORDER — VITAMIN C 500 MG PO TABS
1000.0000 mg | ORAL_TABLET | Freq: Every day | ORAL | Status: DC
  Administered 2017-02-07 – 2017-02-09 (×3): 1000 mg via ORAL
  Filled 2017-02-06 (×3): qty 2

## 2017-02-06 MED ORDER — VITAMIN D 1000 UNITS PO TABS
1000.0000 [IU] | ORAL_TABLET | Freq: Every day | ORAL | Status: DC
  Administered 2017-02-07 – 2017-02-09 (×3): 1000 [IU] via ORAL
  Filled 2017-02-06 (×3): qty 1

## 2017-02-06 MED ORDER — OXYCODONE HCL 5 MG PO TABS: 5.0000 mg | ORAL_TABLET | Freq: Once | ORAL | Status: DC | PRN

## 2017-02-06 MED ORDER — HYDROMORPHONE HCL 1 MG/ML IJ SOLN
0.2500 mg | INTRAMUSCULAR | Status: DC | PRN
  Administered 2017-02-06: 0.25 mg via INTRAVENOUS

## 2017-02-06 MED ORDER — ALBUTEROL SULFATE (2.5 MG/3ML) 0.083% IN NEBU
INHALATION_SOLUTION | RESPIRATORY_TRACT | Status: AC
  Administered 2017-02-06: 2.5 mg via RESPIRATORY_TRACT
  Filled 2017-02-06: qty 3

## 2017-02-06 MED ORDER — 0.9 % SODIUM CHLORIDE (POUR BTL) OPTIME
TOPICAL | Status: DC | PRN
  Administered 2017-02-06: 1000 mL

## 2017-02-06 MED ORDER — ALBUTEROL SULFATE (2.5 MG/3ML) 0.083% IN NEBU
2.5000 mg | INHALATION_SOLUTION | Freq: Once | RESPIRATORY_TRACT | Status: AC
  Administered 2017-02-06: 2.5 mg via RESPIRATORY_TRACT

## 2017-02-06 MED ORDER — SODIUM CHLORIDE 0.9 % IV SOLN
INTRAVENOUS | Status: DC
  Administered 2017-02-07: 01:00:00
  Filled 2017-02-06: qty 1000

## 2017-02-06 MED ORDER — VANCOMYCIN HCL 1000 MG IV SOLR
INTRAVENOUS | Status: DC | PRN
  Administered 2017-02-06: 1000 mg via INTRAVENOUS

## 2017-02-06 MED ORDER — OXYCODONE HCL 5 MG/5ML PO SOLN: 5.0000 mg | Freq: Once | ORAL | Status: DC | PRN

## 2017-02-06 MED ORDER — ALBUTEROL SULFATE (2.5 MG/3ML) 0.083% IN NEBU
INHALATION_SOLUTION | RESPIRATORY_TRACT | Status: AC
  Filled 2017-02-06: qty 3

## 2017-02-06 MED ORDER — PROPOFOL 10 MG/ML IV BOLUS
INTRAVENOUS | Status: AC
  Filled 2017-02-06: qty 20

## 2017-02-06 MED ORDER — VANCOMYCIN HCL IN DEXTROSE 1-5 GM/200ML-% IV SOLN
1000.0000 mg | Freq: Three times a day (TID) | INTRAVENOUS | Status: DC
  Administered 2017-02-07 – 2017-02-08 (×6): 1000 mg via INTRAVENOUS
  Filled 2017-02-06 (×7): qty 200

## 2017-02-06 MED ORDER — ONDANSETRON HCL 4 MG/2ML IJ SOLN
INTRAMUSCULAR | Status: AC
  Filled 2017-02-06: qty 2

## 2017-02-06 MED ORDER — PROPOFOL 10 MG/ML IV BOLUS
INTRAVENOUS | Status: DC | PRN
  Administered 2017-02-06: 140 mg via INTRAVENOUS

## 2017-02-06 MED ORDER — LIDOCAINE 2% (20 MG/ML) 5 ML SYRINGE
INTRAMUSCULAR | Status: AC
  Filled 2017-02-06: qty 5

## 2017-02-06 MED ORDER — SIMVASTATIN 40 MG PO TABS
40.0000 mg | ORAL_TABLET | Freq: Every day | ORAL | Status: DC
  Administered 2017-02-06: 40 mg via ORAL
  Filled 2017-02-06: qty 1

## 2017-02-06 MED ORDER — NABUMETONE 500 MG PO TABS
750.0000 mg | ORAL_TABLET | Freq: Two times a day (BID) | ORAL | Status: DC
  Administered 2017-02-07 – 2017-02-09 (×6): 750 mg via ORAL
  Filled 2017-02-06 (×7): qty 2

## 2017-02-06 MED ORDER — GABAPENTIN 400 MG PO CAPS
800.0000 mg | ORAL_CAPSULE | Freq: Four times a day (QID) | ORAL | Status: DC
  Administered 2017-02-07 – 2017-02-09 (×11): 800 mg via ORAL
  Filled 2017-02-06 (×11): qty 2

## 2017-02-06 MED ORDER — AMLODIPINE BESYLATE 5 MG PO TABS
5.0000 mg | ORAL_TABLET | Freq: Every day | ORAL | Status: DC
  Administered 2017-02-07 – 2017-02-09 (×3): 5 mg via ORAL
  Filled 2017-02-06 (×3): qty 1

## 2017-02-06 MED ORDER — ALBUTEROL SULFATE HFA 108 (90 BASE) MCG/ACT IN AERS
INHALATION_SPRAY | RESPIRATORY_TRACT | Status: DC | PRN
  Administered 2017-02-06: 2 via RESPIRATORY_TRACT

## 2017-02-06 MED ORDER — HYDROCODONE-ACETAMINOPHEN 10-325 MG PO TABS
1.0000 | ORAL_TABLET | Freq: Four times a day (QID) | ORAL | Status: DC | PRN
  Administered 2017-02-08 – 2017-02-09 (×6): 1 via ORAL
  Filled 2017-02-06 (×6): qty 1

## 2017-02-06 MED ORDER — KETAMINE HCL 10 MG/ML IJ SOLN
INTRAMUSCULAR | Status: DC | PRN
  Administered 2017-02-06 (×2): 50 mg via INTRAVENOUS

## 2017-02-06 MED ORDER — ALPRAZOLAM 0.5 MG PO TABS
0.5000 mg | ORAL_TABLET | Freq: Three times a day (TID) | ORAL | Status: DC
  Administered 2017-02-06 – 2017-02-09 (×8): 0.5 mg via ORAL
  Filled 2017-02-06 (×8): qty 1

## 2017-02-06 MED ORDER — PROMETHAZINE HCL 25 MG/ML IJ SOLN: 6.2500 mg | INTRAMUSCULAR | Status: DC | PRN

## 2017-02-06 MED ORDER — OXYCODONE HCL 5 MG PO TABS
5.0000 mg | ORAL_TABLET | Freq: Four times a day (QID) | ORAL | 0 refills | Status: DC | PRN
Start: 2017-02-06 — End: 2017-03-03

## 2017-02-06 MED ORDER — CLINDAMYCIN PHOSPHATE 900 MG/50ML IV SOLN
INTRAVENOUS | Status: AC
  Filled 2017-02-06: qty 50

## 2017-02-06 MED ORDER — VANCOMYCIN HCL IN DEXTROSE 1-5 GM/200ML-% IV SOLN
INTRAVENOUS | Status: AC
  Filled 2017-02-06: qty 200

## 2017-02-06 MED ORDER — LACTATED RINGERS IV SOLN
INTRAVENOUS | Status: DC | PRN
  Administered 2017-02-06: 21:00:00 via INTRAVENOUS

## 2017-02-06 MED ORDER — CLINDAMYCIN PHOSPHATE 600 MG/50ML IV SOLN
600.0000 mg | Freq: Four times a day (QID) | INTRAVENOUS | Status: DC
  Administered 2017-02-07 (×2): 600 mg via INTRAVENOUS
  Filled 2017-02-06 (×3): qty 50

## 2017-02-06 MED ORDER — FENTANYL CITRATE (PF) 100 MCG/2ML IJ SOLN
INTRAMUSCULAR | Status: AC
  Filled 2017-02-06: qty 2

## 2017-02-06 SURGICAL SUPPLY — 54 items
BANDAGE COBAN STERILE 2 (GAUZE/BANDAGES/DRESSINGS) ×2 IMPLANT
BANDAGE ELASTIC 4 VELCRO ST LF (GAUZE/BANDAGES/DRESSINGS) ×2 IMPLANT
BLADE MINI RND TIP GREEN BEAV (BLADE) IMPLANT
BLADE SURG 15 STRL LF DISP TIS (BLADE) ×1 IMPLANT
BLADE SURG 15 STRL SS (BLADE) ×3
BNDG CMPR 9X4 STRL LF SNTH (GAUZE/BANDAGES/DRESSINGS) ×1
BNDG COHESIVE 4X5 TAN STRL (GAUZE/BANDAGES/DRESSINGS) ×3 IMPLANT
BNDG ESMARK 4X9 LF (GAUZE/BANDAGES/DRESSINGS) ×2 IMPLANT
BNDG GAUZE ELAST 4 BULKY (GAUZE/BANDAGES/DRESSINGS) ×4 IMPLANT
CHLORAPREP W/TINT 26ML (MISCELLANEOUS) ×3 IMPLANT
CORDS BIPOLAR (ELECTRODE) ×2 IMPLANT
COVER BACK TABLE 60X90IN (DRAPES) ×3 IMPLANT
COVER MAYO STAND STRL (DRAPES) ×3 IMPLANT
CUFF TOURNIQUET SINGLE 18IN (TOURNIQUET CUFF) IMPLANT
DRAIN CHANNEL 19F RND (DRAIN) ×2 IMPLANT
DRAPE HALF SHEET 40X57 (DRAPES) ×3 IMPLANT
DRAPE OEC MINIVIEW 54X84 (DRAPES) ×2 IMPLANT
DRAPE SURG 17X23 STRL (DRAPES) ×3 IMPLANT
DRSG ADAPTIC 3X8 NADH LF (GAUZE/BANDAGES/DRESSINGS) ×2 IMPLANT
DRSG AQUACEL AG ADV 3.5X10 (GAUZE/BANDAGES/DRESSINGS) ×2 IMPLANT
DRSG EMULSION OIL 3X3 NADH (GAUZE/BANDAGES/DRESSINGS) ×3 IMPLANT
DRSG TEGADERM 2-3/8X2-3/4 SM (GAUZE/BANDAGES/DRESSINGS) ×2 IMPLANT
EVACUATOR SILICONE 100CC (DRAIN) ×2 IMPLANT
GAUZE SPONGE 4X4 12PLY STRL (GAUZE/BANDAGES/DRESSINGS) ×3 IMPLANT
GLOVE BIO SURGEON STRL SZ7.5 (GLOVE) ×3 IMPLANT
GLOVE BIOGEL PI IND STRL 7.0 (GLOVE) ×1 IMPLANT
GLOVE BIOGEL PI IND STRL 8 (GLOVE) ×1 IMPLANT
GLOVE BIOGEL PI INDICATOR 7.0 (GLOVE) ×2
GLOVE BIOGEL PI INDICATOR 8 (GLOVE) ×2
GLOVE ECLIPSE 6.5 STRL STRAW (GLOVE) ×3 IMPLANT
GOWN STRL REUS W/ TWL LRG LVL3 (GOWN DISPOSABLE) ×2 IMPLANT
GOWN STRL REUS W/TWL LRG LVL3 (GOWN DISPOSABLE) ×6
GOWN STRL REUS W/TWL XL LVL3 (GOWN DISPOSABLE) ×3 IMPLANT
NDL HYPO 25X1 1.5 SAFETY (NEEDLE) IMPLANT
NEEDLE HYPO 25X1 1.5 SAFETY (NEEDLE) IMPLANT
NS IRRIG 1000ML POUR BTL (IV SOLUTION) ×3 IMPLANT
PACK BASIN DAY SURGERY FS (CUSTOM PROCEDURE TRAY) ×3 IMPLANT
PACK ORTHO EXTREMITY (CUSTOM PROCEDURE TRAY) ×2 IMPLANT
PADDING CAST ABS 4INX4YD NS (CAST SUPPLIES)
PADDING CAST ABS COTTON 4X4 ST (CAST SUPPLIES) IMPLANT
RUBBERBAND STERILE (MISCELLANEOUS) IMPLANT
SET IRRIG Y TYPE TUR BLADDER L (SET/KITS/TRAYS/PACK) ×2 IMPLANT
SLING ARM FOAM STRAP XLG (SOFTGOODS) ×2 IMPLANT
STOCKINETTE 6  STRL (DRAPES) ×2
STOCKINETTE 6 STRL (DRAPES) ×1 IMPLANT
SUT ETHILON 2 0 FS 18 (SUTURE) ×4 IMPLANT
SUT ETHILON O TP 1 (SUTURE) ×2 IMPLANT
SUT VICRYL RAPIDE 4-0 (SUTURE) IMPLANT
SUT VICRYL RAPIDE 4/0 PS 2 (SUTURE) IMPLANT
SYR BULB 3OZ (MISCELLANEOUS) ×1 IMPLANT
SYRINGE 10CC LL (SYRINGE) ×2 IMPLANT
TOWEL OR 17X24 6PK STRL BLUE (TOWEL DISPOSABLE) ×3 IMPLANT
TOWEL OR NON WOVEN STRL DISP B (DISPOSABLE) ×1 IMPLANT
UNDERPAD 30X30 (UNDERPADS AND DIAPERS) ×3 IMPLANT

## 2017-02-06 NOTE — Anesthesia Preprocedure Evaluation (Signed)
Anesthesia Evaluation  Patient identified by MRN, date of birth, ID band Patient awake    Reviewed: Allergy & Precautions, NPO status , Patient's Chart, lab work & pertinent test results  Airway Mallampati: III  TM Distance: >3 FB Neck ROM: Full    Dental no notable dental hx. (+) Poor Dentition, Chipped, Missing   Pulmonary shortness of breath, Current Smoker,  Snores- probable OSA undiagnosed   Pulmonary exam normal breath sounds clear to auscultation       Cardiovascular hypertension, Pt. on medications Normal cardiovascular exam Rhythm:Regular Rate:Normal     Neuro/Psych Anxiety    GI/Hepatic Neg liver ROS, GERD  ,  Endo/Other  Obesity Hyperlipidemia  Renal/GU negative Renal ROS  negative genitourinary   Musculoskeletal  (+) Arthritis , Fx dislocation right elbow   Abdominal (+) + obese,   Peds  Hematology   Anesthesia Other Findings   Reproductive/Obstetrics                             Lab Results  Component Value Date   WBC 10.0 02/06/2017   HGB 13.8 02/06/2017   HCT 45.5 02/06/2017   MCV 89.4 02/06/2017   PLT 183 02/06/2017   EKG: normal sinus rhythm, baseline artifact.  Anesthesia Physical  Anesthesia Plan  ASA: II  Anesthesia Plan: General   Post-op Pain Management:    Induction: Intravenous  Airway Management Planned: LMA  Additional Equipment:   Intra-op Plan:   Post-operative Plan: Extubation in OR  Informed Consent: I have reviewed the patients History and Physical, chart, labs and discussed the procedure including the risks, benefits and alternatives for the proposed anesthesia with the patient or authorized representative who has indicated his/her understanding and acceptance.   Dental advisory given  Plan Discussed with: CRNA  Anesthesia Plan Comments:        Anesthesia Quick Evaluation

## 2017-02-06 NOTE — Progress Notes (Signed)
Pharmacy Antibiotic Note  Marie Juarez is a 61 y.o. female admitted on 02/06/2017 with R elbow deep infection. (s/p reconstructive surgery 12/19/16).  Pharmacy has been consulted for Vancomycin IV and Gentamicin irrigation dosing. Pt also on Clindamycin. S/p I&D in OR 2/22.  Vancomycin 1gm given pre-op ~2130.  Plan: Gentamicin 500mg  in 1000ml NS IRRIGATION to be given through wound irrigation catheter via IV pump at 310ml/hr. (per MD orders) Vancomycin 1gm IV q8h. First dose now as pt needs 2gm IV loading dose. Will f/u micro data, renal function, and pt's clinical condition Vanc trough prn   Height: 5\' 2"  (157.5 cm) Weight: 210 lb 12.8 oz (95.6 kg) IBW/kg (Calculated) : 50.1  Temp (24hrs), Avg:98.8 F (37.1 C), Min:98.2 F (36.8 C), Max:99.9 F (37.7 C)   Recent Labs Lab 02/06/17 1613  WBC 10.0  CREATININE 0.81    Estimated Creatinine Clearance: 80.6 mL/min (by C-G formula based on SCr of 0.81 mg/dL).    Allergies  Allergen Reactions  . Keflex [Cephalexin] Hives and Itching    Antimicrobials this admission: 2/22 Vanc >>  2/22 Clinda >>  2/22 Gentamicin wound irrigation >>  Dose adjustments this admission: n/a  Microbiology results: 2/22 Wound Cx:  2/22 MRSA PCR: negative  Thank you for allowing pharmacy to be a part of this patient's care.  Christoper Fabianaron Franko Hilliker, PharmD, BCPS Clinical pharmacist, pager [phone] 02/06/2017 11:47 PM

## 2017-02-06 NOTE — Anesthesia Procedure Notes (Signed)
Procedure Name: LMA Insertion Date/Time: 02/06/2017 9:05 PM Performed by: Arlice ColtMANESS, Keighley Deckman B Pre-anesthesia Checklist: Patient identified, Emergency Drugs available, Suction available, Patient being monitored and Timeout performed Patient Re-evaluated:Patient Re-evaluated prior to inductionOxygen Delivery Method: Circle system utilized Preoxygenation: Pre-oxygenation with 100% oxygen Intubation Type: IV induction LMA: LMA inserted LMA Size: 4.0 Number of attempts: 1 Placement Confirmation: positive ETCO2 and breath sounds checked- equal and bilateral Tube secured with: Tape Dental Injury: Teeth and Oropharynx as per pre-operative assessment

## 2017-02-06 NOTE — H&P (Signed)
Marie FritzBarbara T Juarez is an 61 y.o. female.   Chief Complaint: R elbow wound drainage HPI: This patient underwent reconstructive surgery for her right elbow following trauma on 12-19-16.  She had done well, until she reports falling this past Sunday.  She developed some drainage from the wound at that time and presented for her regularly scheduled follow-up appointment today, with a Band-Aid over a portion of her wound.  She denies any fevers or chills.  In the office, the wound was opened slightly and liquid and flocculent material expressed.  Past Medical History:  Diagnosis Date  . Anxiety   . Dyspnea    with exertion  . Edema   . Fracture dislocation of right elbow joint   . GERD (gastroesophageal reflux disease)   . Hyperlipidemia   . Hypertension   . 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 CESAREAN SECTION    . HEEL SPUR SURGERY    . ORIF ELBOW FRACTURE Right 12/19/2016   Procedure: OPEN TREATMENT OF RIGHT ELBOW-DISLOCATION FRACTURE;  Surgeon: Mack Hookavid Rasool Rommel, MD;  Location: [facility] OR;  Service: Orthopedics;  Laterality: Right;  GENERAL ANESTHESIA WITH PRE-OP BLOCK  . TARSAL TUNNEL RELEASE Left     Family History  Problem Relation Age of Onset  . Diabetes Mother   . Hypertension Mother   . Stroke Father   . Diabetes Sister   . Hypertension Sister   . Diabetes Brother     borderline  . Diabetes Maternal Grandmother   . Hypertension Maternal Grandmother   . Stroke Maternal Grandfather   . Diabetes Brother   . Diabetes Brother    Social History:  reports that she has been smoking Cigarettes.  She has a 45.00 pack-year smoking history. She has never used smokeless tobacco. She reports that she does not drink alcohol or use drugs.  Allergies:  Allergies  Allergen Reactions  . Keflex [Cephalexin] Hives and Itching    No prescriptions prior to admission.    No results found for this or any previous visit (from the past 48  hour(s)). No results found.  ROS  There were no vitals taken for this visit. Physical Exam  Constitutional:  WD, WN, NAD HEENT:  NCAT, EOMI Neuro/Psych:  Alert & oriented to person, place, and time; appropriate mood & affect Lymphatic: No generalized UE edema or lymphadenopathy Extremities / MSK:  Both UE are normal with respect to appearance, ranges of motion, joint stability, muscle strength/tone, sensation, & perfusion except as otherwise noted:  Right elbow range now 40-95, with nearly full pronation supination.  The soft tissues posteriorly are firm and swollen, slightly warm and reddened, and there is a pinhole opening at the wound, 3-4 cm from the distal end.  Expressible drainage is present, a mixture of brown thin fluid with some flocculent material within it.  The wound is open slightly with scissors, and the depths of the wound or at least 1-1/2 cm.  Some additional drainage is expressible.  Labs / Xrays:  Multiple views of the right elbow ordered and obtained today reveals mild joint, intact radial head replacement and internal joint stabilizer, with some rounded lucencies in the posterior soft tissues  Assessment:  6 weeks following above procedure, with new wound drainage following a fall, possibly with deep infection  Plan: I discussed with her the need for admission today, surgical exploration with irrigation, etc. to ascertain the extent of the possible infection  and to obtain deep tissue cultures.  We will start her on antibiotics after the procedure this evening, likely with broad coverage until Gram stain/cultures guide additional decision-making.  I conveyed to her the possible grave nature of this development, questions were invited and answered, and consent obtained.  She is sent to the hospital for direct admission and surgery later this evening.  Timur Nibert A., MD 02/06/2017, 12:48 PM

## 2017-02-06 NOTE — Discharge Instructions (Signed)
Discharge Instructions   You have a light dressing on your hand.  You may begin gentle motion of your fingers and hand immediately, but you should not do any heavy lifting or gripping.  Elevate your hand to reduce pain & swelling of the digits.  Ice over the operative site may be helpful to reduce pain & swelling.  DO NOT USE HEAT. Pain medicine has been prescribed for you.  Leave the dressing in place until the third day after your surgery and then remove it, leaving it open to air.  After the bandage has been removed you may shower, regularly washing the incision and letting the water run over it, but not submerging it (no swimming, soaking it in dishwater, etc.) No driving yet We will address whether therapy will be required or not when you return to the office. Please call our office today or the next business day to make your return appointment for 10-15 days after surgery.   Please call [phone] during normal business hours or [phone] after hours for any problems. Including the following:  - excessive redness of the incisions - drainage for more than 4 days - fever of more than 101.5 F  *Please note that pain medications will not be refilled after hours or on weekends.

## 2017-02-06 NOTE — Transfer of Care (Signed)
Immediate Anesthesia Transfer of Care Note  Patient: Marie FritzBarbara T Henigan  Procedure(s) Performed: Procedure(s): IRRIGATION AND DEBRIDEMENT ELBOW and HARDWARE REMOVAL (Right)  Patient Location: PACU  Anesthesia Type:General  Level of Consciousness: responds to stimulation  Airway & Oxygen Therapy: Patient Spontanous Breathing and Patient connected to face mask oxygen  Post-op Assessment: Report given to RN and Post -op Vital signs reviewed and stable  Post vital signs: Reviewed and stable  Last Vitals:  Vitals:   02/06/17 1418 02/06/17 2204  BP: (!) 151/76 (!) 153/58  Pulse: 100 79  Resp:  17  Temp: 37.2 C 36.8 C    Last Pain:  Vitals:   02/06/17 2204  TempSrc: Oral  PainSc:          Complications: No apparent anesthesia complications

## 2017-02-06 NOTE — Op Note (Signed)
02/06/2017  8:51 PM  PATIENT:  Marie Juarez  61 y.o. female  PRE-OPERATIVE DIAGNOSIS:  Right elbow postop drainage with suspected deep infection  POST-OPERATIVE DIAGNOSIS:  Same  PROCEDURE:   1.  Right elbow postoperative wound exploration, excisional debridement debridement with irrigation    2.  Right elbow hardware removal  SURGEON: Cliffton Asters. [name], MD  PHYSICIAN ASSISTANT: None  ANESTHESIA:  general  SPECIMENS:  None  DRAINS:   None  EBL:  less than 100 mL  PREOPERATIVE INDICATIONS:  Marie Juarez is a  61 y.o. female with a fall 5 days ago, followed by opening of a portion of her right elbow postop incision and drainage since then.  Some warmth, redness, with expressible flocculant drainage.  The risks benefits and alternatives were discussed with the patient preoperatively including but not limited to the risks of infection, bleeding, nerve injury, cardiopulmonary complications, the need for revision surgery, among others, and the patient verbalized understanding and consented to proceed.  OPERATIVE IMPLANTS: None  OPERATIVE PROCEDURE:  After withholding prophylactic antibiotics, the patient was escorted to the operative theatre and placed in a supine position.  General anesthesia was administered.  A surgical "time-out" was performed during which the planned procedure, proposed operative site, and the correct patient identity were compared to the operative consent and agreement confirmed by the circulating nurse according to current facility policy.  Following application of a tourniquet to the operative extremity, the exposed skin was prepped with Chloraprep and draped in the usual sterile fashion.  The limb was exsanguinated with an Esmarch bandage and the tourniquet inflated to approximately higher than systolic BP.  The draining tract was elliptically excised and a portion of the previous incision extended proximally and distally  .  The fluid tracked  laterally, where there was a subcutaneous pocket about the hardware.  The internal joint stabilization hardware was removed, as the fluid collection was obviously in contact with it.  Before it was removed, and it was just disconnected from each other, the elbow was evaluated fluoroscopically, and throughout its range of motion, it did not dislocate.  There did appear to be some increased ulnohumeral joint space resulting in some mild posterior subluxation of the joint.  Nonetheless, it was determined that the internal joint stabilization device should not remain.  Cultures were obtained.  The surfaces of the fluid pocket were debrided with scraping dissection and sharp dissection.  The prosthetic radial head was not encountered until a small capsulotomy was made laterally.  The interior of the joint was irrigated as was the remainder of the wound.  It was done copiously with running irrigant.  After cultures were obtained, vancomycin and clindamycin were administered.  After sufficient debridement was performed, a Blake drain was placed exiting distally through a intact subcutaneous tunnel.  An irrigation catheter was placed proximally.  The wound was closed with 2-0 nylon interrupted sutures in both the drain and the irrigation catheter were sewn in.  An Aquasol dressing was applied and the drain was placed to continuous suction.  There remained a small leak, likely from the irrigation catheter.  A dressing was applied with an Ace overwrap and she was taken to the recovery room in stable condition, breathing spontaneously  DISPOSITION: She'll be transferred to the floor for routine postoperative care.  In addition to broad-spectrum antibiotics initially, we will also irrigate with high concentration gentamicin irrigation for a couple of days and leave the drain in place with intermittent wall  suction applied.  Additional antibiotic management decisions will be made after Gram stain/culture results are  available.

## 2017-02-07 ENCOUNTER — Encounter (HOSPITAL_COMMUNITY): Payer: Self-pay | Admitting: Orthopedic Surgery

## 2017-02-07 MED ORDER — ATORVASTATIN CALCIUM 20 MG PO TABS
20.0000 mg | ORAL_TABLET | Freq: Every day | ORAL | Status: DC
  Administered 2017-02-07 – 2017-02-08 (×2): 20 mg via ORAL
  Filled 2017-02-07 (×2): qty 1

## 2017-02-07 NOTE — Consult Note (Signed)
[facility]  Total days of antibiotics 2        Day 2 vanco        Day 2 clinda               Reason for Consult: deep tissue infection of right elbow   Referring Physician: Grandville Silos  Active Problems:   Postoperative wound infection   Postoperative infection    HPI: Marie Juarez is a 61 y.o. female who sustained right elbow injury include fracture dislocation in late December. She was referred to Dr Grandville Silos where he admitted her for repair to include radial head excision and replacement with prosthetic implant, right elbow lateral collateral ligament repair on 1/4. The patient had been doing well until roughly 5 days ago, where she sustained a ground level fall to her right elbow and sustained small abrasion. She cleaned with soap and water but waited for her follow up appt on 2/22 with dr Grandville Silos for evaluation. During her follow up visit, it was noted to have some warmth, redness and expressed drainage that was possibly purulent. She was admitted last night for I x D, wound exploration and removal of right elbow hardware. Dr Maryln Gottron noted that he fluid collection tracked laterally with a pocket about the hardware that it was in contact with (thus internal joint stabilization HW was removed). The fluid sinus tract did not come in contact with the prosthetic radial head. A small capsulotomy was made in order to irrigate the joint. cx were sent, patient has blake drain in place. She ws started on vancomycin and clindamycin due to having cefazolin allergy of hives.  She is sleepy post her procedure. Her mother is with her who has been helping her, and has a son that is 5yo also living with her to assist in her needs since her right elbow fracture  Past Medical History:  Diagnosis Date  . Anxiety   . Arthritis    "knees, hips" (02/06/2017)  . Bulging lumbar disc   . Chronic lower back pain   . Edema   . Fracture dislocation of right elbow joint   . GERD  (gastroesophageal reflux disease)   . Hyperlipidemia   . Hypertension   . 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 Shortness of breath    with exertion    Allergies:  Allergies  Allergen Reactions  . Keflex [Cephalexin] Hives and Itching    MEDICATIONS: . ALPRAZolam  0.5 mg Oral TID  . amLODipine  5 mg Oral Daily  . atorvastatin  20 mg Oral q1800  . cholecalciferol  1,000 Units Oral Daily  . furosemide  20 mg Oral Daily  . gabapentin  800 mg Oral QID  . heparin  5,000 Units Subcutaneous Q8H  . nabumetone  750 mg Oral BID  . vancomycin  1,000 mg Intravenous Q8H  . vitamin C  1,000 mg Oral Daily    Social History  Substance Use Topics  . Smoking status: Current Every Day Smoker    Packs/day: 1.50    Years: 37.00    Types: Cigarettes  . Smokeless tobacco: Never Used  . Alcohol use No    Family History  Problem Relation Age of Onset  . Diabetes Mother   . Hypertension Mother   . Stroke Father   . Diabetes Sister   . Hypertension Sister   . Diabetes Brother     borderline  . Diabetes Maternal Grandmother   .  Hypertension Maternal Grandmother   . Stroke Maternal Grandfather   . Diabetes Brother   . Diabetes Brother     Review of Systems  Constitutional: Negative for fever, chills, diaphoresis, activity change, appetite change, fatigue and unexpected weight change.  HENT: Negative for congestion, sore throat, rhinorrhea, sneezing, trouble swallowing and sinus pressure.  Eyes: Negative for photophobia and visual disturbance.  Respiratory: Negative for cough, chest tightness, shortness of breath, wheezing and stridor.  Cardiovascular: Negative for chest pain, palpitations and leg swelling.  Gastrointestinal: Negative for nausea, vomiting, abdominal pain, diarrhea, constipation, blood in stool, abdominal distention and anal bleeding.  Genitourinary: Negative for dysuria, hematuria, flank pain and difficulty urinating.  Musculoskeletal: + right elbow painNegative for  myalgias, back pain, joint swelling, arthralgias and gait problem.  Skin: Negative for color change, pallor, rash and wound.  Neurological: Negative for dizziness, tremors, weakness and light-headedness.  Hematological: Negative for adenopathy. Does not bruise/bleed easily.  Psychiatric/Behavioral: Negative for behavioral problems, confusion, sleep disturbance, dysphoric mood, decreased concentration and agitation.     OBJECTIVE: Temp:  [97.7 F (36.5 C)-99.9 F (37.7 C)] 97.7 F (36.5 C) (02/23 1418) Pulse Rate:  [79-95] 84 (02/23 1418) Resp:  [11-17] 16 (02/23 1418) BP: (113-157)/(58-88) 113/60 (02/23 1418) SpO2:  [92 %-99 %] 95 % (02/23 1418) Physical Exam  Constitutional:  oriented to person, place, and time. appears well-developed and well-nourished. No distress.  HENT: [place]/AT, PERRLA, no scleral icterus Mouth/Throat: Oropharynx is clear and moist. No oropharyngeal exudate.  Cardiovascular: Normal rate, regular rhythm and normal heart sounds. Exam reveals no gallop and no friction rub.  No murmur heard.  Pulmonary/Chest: Effort normal and breath sounds normal. No respiratory distress.  has no wheezes.  Neck = supple, no nuchal rigidity Abdominal: Soft. Bowel sounds are normal.  exhibits no distension. There is no tenderness.  Lymphadenopathy: no cervical adenopathy. No axillary adenopathy Ext: right elbow is wrapped. serosanginous drainage in blake bulb Neurological: alert and oriented to person, place, and time.  Skin: Skin is warm and dry. No rash noted. No erythema.  Psychiatric: a normal mood and affect.  behavior is normal.    LABS: Results for orders placed or performed during the hospital encounter of 02/06/17 (from the past 48 hour(s))  CBC WITH DIFFERENTIAL     Status: Abnormal   Collection Time: 02/06/17  4:13 PM  Result Value Ref Range   WBC 10.0 4.0 - 10.5 K/uL   RBC 5.09 3.87 - 5.11 MIL/uL   Hemoglobin 13.8 12.0 - 15.0 g/dL   HCT 45.5 36.0 - 46.0 %   MCV  89.4 78.0 - 100.0 fL   MCH 27.1 26.0 - 34.0 pg   MCHC 30.3 30.0 - 36.0 g/dL   RDW 15.8 (H) 11.5 - 15.5 %   Platelets 183 150 - 400 K/uL   Neutrophils Relative % 79 %   Neutro Abs 7.9 (H) 1.7 - 7.7 K/uL   Lymphocytes Relative 15 %   Lymphs Abs 1.5 0.7 - 4.0 K/uL   Monocytes Relative 5 %   Monocytes Absolute 0.5 0.1 - 1.0 K/uL   Eosinophils Relative 1 %   Eosinophils Absolute 0.1 0.0 - 0.7 K/uL   Basophils Relative 0 %   Basophils Absolute 0.0 0.0 - 0.1 K/uL  Basic metabolic panel     Status: Abnormal   Collection Time: 02/06/17  4:13 PM  Result Value Ref Range   Sodium 139 135 - 145 mmol/L   Potassium 4.3 3.5 - 5.1 mmol/L  Chloride 97 (L) 101 - 111 mmol/L   CO2 31 22 - 32 mmol/L   Glucose, Bld 92 65 - 99 mg/dL   BUN 5 (L) 6 - 20 mg/dL   Creatinine, Ser 0.81 0.44 - 1.00 mg/dL   Calcium 8.5 (L) 8.9 - 10.3 mg/dL   GFR calc non Af Amer >60 >60 mL/min   GFR calc Af Amer >60 >60 mL/min    Comment: (NOTE) The eGFR has been calculated using the CKD EPI equation. This calculation has not been validated in all clinical situations. eGFR's persistently <60 mL/min signify possible Chronic Kidney Disease.    Anion gap 11 5 - 15  Sedimentation rate     Status: None   Collection Time: 02/06/17  4:13 PM  Result Value Ref Range   Sed Rate 2 0 - 22 mm/hr  C-reactive protein     Status: Abnormal   Collection Time: 02/06/17  4:13 PM  Result Value Ref Range   CRP 1.5 (H) <1.0 mg/dL  Surgical pcr screen     Status: None   Collection Time: 02/06/17  5:43 PM  Result Value Ref Range   MRSA, PCR NEGATIVE NEGATIVE   Staphylococcus aureus NEGATIVE NEGATIVE    Comment:        The Xpert SA Assay (FDA approved for NASAL specimens in patients over 55 years of age), is one component of a comprehensive surveillance program.  Test performance has been validated by [facility] for patients greater than or equal to 62 year old. It is not intended to diagnose infection nor to guide or monitor  treatment.   Aerobic/Anaerobic Culture (surgical/deep wound)     Status: None (Preliminary result)   Collection Time: 02/06/17  9:38 PM  Result Value Ref Range   Specimen Description WOUND    Special Requests RT ELBOW POST OP INFECTION    Gram Stain      FEW WBC PRESENT,BOTH PMN AND MONONUCLEAR NO ORGANISMS SEEN    Culture NO GROWTH < 12 HOURS    Report Status PENDING    Lab Results  Component Value Date   ESRSEDRATE 2 02/06/2017   Lab Results  Component Value Date   CRP 1.5 (H) 02/06/2017    MICRO: 2/22 tissue cx = pending  Assessment/Plan:  61yo F with hx of right elbow reconstructive surgery on 1/4 admitted for deep tissue infection involving internal fixation device, possibly from recent abrasion and break in skin POD#1 from washout and removal of involved HW. Still has prosthetic material but not thought to be involved - continue with vancomycin alone for the time being - await culture results to see if need to change abtx regimen - due to location and HW involvement, would recommend 4 wk of abtx. Likely needs picc line  Health maintenance - will check hiv and hep c ab.

## 2017-02-07 NOTE — Progress Notes (Signed)
Patient received from PACU s/p I&D of right elbow. Alert and oriented x4.VSS , pain 4/10. O2 at 4L/min.Will monitor.

## 2017-02-07 NOTE — Progress Notes (Signed)
02-06-17--R elbow postop wound debridement with Hwr Rem  Sitting up, eating, tol PO, O2 applied. Pain tolerable On Vanco, Clinda d/c'd by ID  Hand puffy, with good Dig ROM, NVI Wound infusion catheter working well, Blake drain on low intermittent suction Cultures pending (+ WBC, no orgs)  Plan:  1. Stop gentamicin wound irrigation tomorrow and pull Gent catheter (sewn to skin) 2. Continue Blake drain suction tomorrow after pulling Gent catheter 3. PICC line insertion ordered 4. Care management ordered for home IV antibiotics--guided by final recs from ID 5. May D/C home once plan able to be implemented, f/u with me in 2 weeks 6. Will need Blake drain pulled prior to d/c home (also sewn to skin)

## 2017-02-07 NOTE — Care Management Note (Signed)
Case Management Note  Patient Details  Name: Marie Juarez MRN: [id number] Date of Birth: 02/22/1956  Subjective/Objective:                    Action/Plan:  Continue to follow for disposition needs    high concentration gentamicin irrigation for a couple of days and leave the drain in place with intermittent wall suction applied.  Additional antibiotic management decisions will be made after Gram stain/culture results are available. Expected Discharge Date:                  Expected Discharge Plan:     In-House Referral:     Discharge planning Services     Post Acute Care Choice:    Choice offered to:     DME Arranged:    DME Agency:     HH Arranged:    HH Agency:     Status of Service:  In process, will continue to follow  If discussed at Long Length of Stay Meetings, dates discussed:    Additional Comments:  Kingsley PlanWile, [name], RN 02/07/2017, 2:02 PM

## 2017-02-08 DIAGNOSIS — L0889 Other specified local infections of the skin and subcutaneous tissue: Secondary | ICD-10-CM

## 2017-02-08 DIAGNOSIS — Y792 Prosthetic and other implants, materials and accessory orthopedic devices associated with adverse incidents: Secondary | ICD-10-CM

## 2017-02-08 DIAGNOSIS — T84619D Infection and inflammatory reaction due to internal fixation device of unspecified bone of arm, subsequent encounter: Secondary | ICD-10-CM

## 2017-02-08 DIAGNOSIS — B9689 Other specified bacterial agents as the cause of diseases classified elsewhere: Secondary | ICD-10-CM

## 2017-02-08 LAB — COMPREHENSIVE METABOLIC PANEL
ALBUMIN: 2.9 g/dL — AB (ref 3.5–5.0)
ALT: 6 U/L — ABNORMAL LOW (ref 14–54)
ANION GAP: 7 (ref 5–15)
AST: 11 U/L — ABNORMAL LOW (ref 15–41)
Alkaline Phosphatase: 70 U/L (ref 38–126)
BILIRUBIN TOTAL: 1 mg/dL (ref 0.3–1.2)
BUN: 8 mg/dL (ref 6–20)
CO2: 32 mmol/L (ref 22–32)
Calcium: 8.4 mg/dL — ABNORMAL LOW (ref 8.9–10.3)
Chloride: 103 mmol/L (ref 101–111)
Creatinine, Ser: 0.84 mg/dL (ref 0.44–1.00)
GFR calc Af Amer: >60 mL/min (ref >60)
Glucose, Bld: 97 mg/dL (ref 65–99)
POTASSIUM: 4.1 mmol/L (ref 3.5–5.1)
Sodium: 142 mmol/L (ref 135–145)
TOTAL PROTEIN: 5.6 g/dL — AB (ref 6.5–8.1)

## 2017-02-08 LAB — VANCOMYCIN, TROUGH: Vancomycin Tr: 35 ug/mL (ref 15–20)

## 2017-02-08 LAB — HIV ANTIBODY (ROUTINE TESTING W REFLEX): HIV SCREEN 4TH GENERATION: NONREACTIVE

## 2017-02-08 MED ORDER — VANCOMYCIN HCL IN DEXTROSE 1-5 GM/200ML-% IV SOLN
1000.0000 mg | Freq: Two times a day (BID) | INTRAVENOUS | Status: DC
  Administered 2017-02-09: 1000 mg via INTRAVENOUS
  Filled 2017-02-08 (×2): qty 200

## 2017-02-08 NOTE — Progress Notes (Signed)
Pharmacy Antibiotic Note Marie Juarez is a 61 y.o. female admitted on 02/06/2017 with deep elbow infection that is s/p I&D on 2/22.  Currently on day 2 of vancomycin.  Vancomycin trough of 35 is above desired range of 15-20.  SCr stable. ID planning to treat with 4 weeks of IV vancomycin with stop date of 03/05/17.  Plan: Adjust vancomycin to 1 gram IV every 12 hours starting on 2/25 at 0800   Height: 5\' 2"  (157.5 cm) Weight: 210 lb 12.8 oz (95.6 kg) IBW/kg (Calculated) : 50.1  Temp (24hrs), Avg:98.3 F (36.8 C), Min:98.2 F (36.8 C), Max:98.5 F (36.9 C)   Recent Labs Lab 02/06/17 1613 02/08/17 0454 02/08/17 1601  WBC 10.0  --   --   CREATININE 0.81 0.84  --   VANCOTROUGH  --   --  35*    Estimated Creatinine Clearance: 77.8 mL/min (by C-G formula based on SCr of 0.84 mg/dL).    Allergies  Allergen Reactions  . Keflex [Cephalexin] Hives and Itching    Antimicrobials this admission: 2/22 Vancomycin >>  2/22 Clindamycin >>  2/22 Gentamycin irrigation >>  Microbiology results: 2/22 elbow wound cx - rare staph epidermis  2/22 MRSA PCR - negative  Thank you for allowing pharmacy to be a part of this patient's care.  Pollyann SamplesAndy Mayre Juarez, PharmD, BCPS 02/08/2017, 5:20 PM

## 2017-02-08 NOTE — Progress Notes (Signed)
PATIENT ID: Mliss FritzBarbara T Pontius  MRN: [id number]  DOB/AGE:  12/18/1955 / 61 y.o.  2 Days Post-Op Procedure(s) (LRB): IRRIGATION AND DEBRIDEMENT ELBOW and HARDWARE REMOVAL (Right)    PROGRESS NOTE Subjective:   Patient is alert, oriented, yes Nausea, no Vomiting, yes passing gas, yes Bowel Movement. Taking PO well, with pt eating breakfast. Denies SOB, Chest or Calf Pain. Using Incentive Spirometer, PAS in place. Non weight bearing on right arm, Patient reports pain as moderate,    Objective: Vital signs in last 24 hours: Temp:  [97.7 F (36.5 C)-98.5 F (36.9 C)] 98.2 F (36.8 C) (02/24 0513) Pulse Rate:  [78-84] 78 (02/23 2126) Resp:  [16-17] 17 (02/24 0513) BP: (113-126)/(60-70) 122/60 (02/24 0513) SpO2:  [93 %-96 %] 96 % (02/24 0518)    Intake/Output from previous day: I/O last 3 completed shifts: In: 2020.7 [P.O.:960; I.V.:460.7; IV Piggyback:600] Out: 1450 [Urine:1400; Blood:50]   Intake/Output this shift: No intake/output data recorded.   LABORATORY DATA:  Recent Labs  02/06/17 1613 02/08/17 0454  WBC 10.0  --   HGB 13.8  --   HCT 45.5  --   PLT 183  --   NA 139 142  K 4.3 4.1  CL 97* 103  CO2 31 32  BUN 5* 8  CREATININE 0.81 0.84  GLUCOSE 92 97  CALCIUM 8.5* 8.4*    Examination: Neurologically intact Neurovascular intact Sensation intact distally Intact pulses distally Dorsiflexion/Plantar flexion intact} Edema distally in fingers Gent drip/infusion pulled.  Left drain in place.  Assessment:   2 Days Post-Op Procedure(s) (LRB): IRRIGATION AND DEBRIDEMENT ELBOW and HARDWARE REMOVAL (Right) ADDITIONAL DIAGNOSIS:     Plan:  1. Pulled Gent infusion 2. Continue Blake drain suction  3. PICC line insertion ordered, awaiting placement 4. Care management ordered for home IV antibiotics--guided by final recs from ID - looking like ID recommending Vanc alone for the time being 5. May D/C home once plan able to be implemented, f/u with Dr. Janee Mornhompson in 2  weeks 6. Will need Blake drain pulled prior to d/c home (also sewn to skin)     Arnett Duddy R 02/08/2017, 8:26 AM

## 2017-02-08 NOTE — Progress Notes (Signed)
Spoke with Dr Luciana Axeomer on telephone regarding PICC order.  Will hold PICC until seen by Dr Luciana Axeomer this shift.

## 2017-02-08 NOTE — Progress Notes (Addendum)
[facility]   Reason for visit: Follow up on deep elbow infection with hardware removal  Interval History: no fever, wants to go home, no diarrhea, no rash.  No significant pain from elbow; OP report reviewed and no involvement of prosthesis; no growth on culture to date   Physical Exam: Constitutional:  Vitals:   02/07/17 2126 02/08/17 0513  BP: 125/66 122/60  Pulse: 78   Resp: 17 17  Temp: 98.3 F (36.8 C) 98.2 F (36.8 C)   patient appears in NAD Eyes: anicteric HENT: no thrush Respiratory: Normal respiratory effort; CTA B Cardiovascular: RRR GI: soft, nt, nd MS: right elbow wrapped  Review of Systems: Constitutional: negative for fevers, chills and anorexia Gastrointestinal: negative for diarrhea Integument/breast: negative for rash  Lab Results  Component Value Date   WBC 10.0 02/06/2017   HGB 13.8 02/06/2017   HCT 45.5 02/06/2017   MCV 89.4 02/06/2017   PLT 183 02/06/2017    Lab Results  Component Value Date   CREATININE 0.84 02/08/2017   BUN 8 02/08/2017   NA 142 02/08/2017   K 4.1 02/08/2017   CL 103 02/08/2017   CO2 32 02/08/2017    Lab Results  Component Value Date   ALT 6 (L) 02/08/2017   AST 11 (L) 02/08/2017   ALKPHOS 70 02/08/2017     Microbiology: Recent Results (from the past 240 hour(s))  Surgical pcr screen     Status: None   Collection Time: 02/06/17  5:43 PM  Result Value Ref Range Status   MRSA, PCR NEGATIVE NEGATIVE Final   Staphylococcus aureus NEGATIVE NEGATIVE Final    Comment:        The Xpert SA Assay (FDA approved for NASAL specimens in patients over 66 years of age), is one component of a comprehensive surveillance program.  Test performance has been validated by [facility] for patients greater than or equal to 67 year old. It is not intended to diagnose infection nor to guide or monitor treatment.   Aerobic/Anaerobic Culture (surgical/deep wound)     Status: None (Preliminary  result)   Collection Time: 02/06/17  9:38 PM  Result Value Ref Range Status   Specimen Description WOUND  Final   Special Requests RT ELBOW POST OP INFECTION  Final   Gram Stain   Final    FEW WBC PRESENT,BOTH PMN AND MONONUCLEAR NO ORGANISMS SEEN    Culture   Final    CULTURE REINCUBATED FOR BETTER GROWTH NO ANAEROBES ISOLATED; CULTURE IN PROGRESS FOR 5 DAYS    Report Status PENDING  Incomplete    Impression/Plan:  1. Deep tissue infection right elbow - no culture to date.  I will have her continue with vanocmycin for 4 weeks through  March 21st  Will initiate pharmacy antibiotic protocol Labs per home health We will arrange follow up with [place] in about 3 weeks crp 1.5, ESR 2 Ok to pull picc line at the end of treatment  2. Medication monitoring - vancomycin trough goal of 15-20  3.  Screening - hep C and HIV pending

## 2017-02-08 NOTE — Progress Notes (Signed)
CRITICAL VALUE ALERT  Critical value received:  Vancomycin trough 35  Date of notification:  02/08/17  Time of notification:  1715  Critical value read back:Yes.    Nurse who received alert:  Shanda BumpsMarissa Sacha Radloff  MD notified (1st page):  Pollyann SamplesAndy Johnston, Pharmacist  Time of first page:  1716  MD notified (2nd page):  Time of second page:  Responding MD:    Time MD responded:  [phone]

## 2017-02-09 LAB — HEPATITIS C ANTIBODY: HCV Ab: <0.1 s/co ratio (ref 0.0–0.9)

## 2017-02-09 MED ORDER — SODIUM CHLORIDE 0.9% FLUSH: 10.0000 mL | INTRAVENOUS | Status: DC | PRN

## 2017-02-09 MED ORDER — VANCOMYCIN HCL 1000 MG IV SOLR: 1000.0000 mg | Freq: Two times a day (BID) | INTRAVENOUS | 1 refills | Status: DC

## 2017-02-09 MED ORDER — VANCOMYCIN IV (FOR PTA / DISCHARGE USE ONLY): 750.0000 mg | Freq: Two times a day (BID) | INTRAVENOUS | 0 refills | Status: DC

## 2017-02-09 NOTE — Progress Notes (Signed)
PATIENT ID: Marie FritzBarbara T Juarez  MRN: [id number]  DOB/AGE:  08/14/1956 / 61 y.o.  3 Days Post-Op Procedure(s) (LRB): IRRIGATION AND DEBRIDEMENT ELBOW and HARDWARE REMOVAL (Right)    PROGRESS NOTE Subjective:   Patient is alert, oriented, no Nausea, no Vomiting, yes passing gas, yes Bowel Movement. Taking PO well. Denies SOB, Chest or Calf Pain. Using Incentive Spirometer, PAS in place. Ambulate WBAT lower extremity and non-weight bearing  To right arm, Patient reports pain as moderate,     Objective: Vital signs in last 24 hours: Temp:  [98.2 F (36.8 C)-98.5 F (36.9 C)] 98.2 F (36.8 C) (02/25 0424) Pulse Rate:  [78-98] 78 (02/25 0424) Resp:  [17-18] 18 (02/25 0424) BP: (114-122)/(62-65) 118/62 (02/25 0424) SpO2:  [90 %-93 %] 93 % (02/25 0424)    Intake/Output from previous day: I/O last 3 completed shifts: In: 560 [P.O.:360; IV Piggyback:200] Out: 1870 [Urine:1850; Drains:20]   Intake/Output this shift: No intake/output data recorded.   LABORATORY DATA:  Recent Labs  02/06/17 1613 02/08/17 0454  WBC 10.0  --   HGB 13.8  --   HCT 45.5  --   PLT 183  --   NA 139 142  K 4.3 4.1  CL 97* 103  CO2 31 32  BUN 5* 8  CREATININE 0.81 0.84  GLUCOSE 92 97  CALCIUM 8.5* 8.4*    Examination: Neurologically intact Neurovascular intact Sensation intact distally Intact pulses distally Incision: moderate drainage} Blood separated in drain and pulled  Assessment:   3 Days Post-Op Procedure(s) (LRB): IRRIGATION AND DEBRIDEMENT ELBOW and HARDWARE REMOVAL (Right) ADDITIONAL DIAGNOSIS:    Plan:  1. Pulled Gent infusion yesterday 2. Pulled Blake drain suction  3. PICC line  Placement this AM 4. Care management ordered for home IV antibiotics--guided by final recs from ID - looking like ID recommending Vanc alone for the time being 5. May D/C home once plan able to be implemented, f/u with Dr. Janee Mornhompson in 2 weeks    Lexine Jaspers R 02/09/2017, 9:18 AM

## 2017-02-09 NOTE — Progress Notes (Signed)
Peripherally Inserted Central Catheter/Midline Placement  The IV Nurse has discussed with the patient and/or persons authorized to consent for the patient, the purpose of this procedure and the potential benefits and risks involved with this procedure.  The benefits include less needle sticks, lab draws from the catheter, and the patient may be discharged home with the catheter. Risks include, but not limited to, infection, bleeding, blood clot (thrombus formation), and puncture of an artery; nerve damage and irregular heartbeat and possibility to perform a PICC exchange if needed/ordered by physician.  Alternatives to this procedure were also discussed.  Bard Power PICC patient education guide, fact sheet on infection prevention and patient information card has been provided to patient /or left at bedside.    PICC/Midline Placement Documentation  PICC Single Lumen 02/09/17 PICC Left Cephalic 38 cm 0 cm (Active)  Indication for Insertion or Continuance of Line Home intravenous therapies (PICC only) 02/09/2017  9:27 AM  Exposed Catheter (cm) 0 cm 02/09/2017  9:27 AM  Site Assessment Clean;Dry;Intact 02/09/2017  9:27 AM  Line Status Flushed;Blood return noted;Saline locked 02/09/2017  9:27 AM  Dressing Type Transparent 02/09/2017  9:27 AM  Dressing Status Clean;Dry;Intact;Antimicrobial disc in place 02/09/2017  9:27 AM  Line Care Connections checked and tightened 02/09/2017  9:27 AM  Line Adjustment (NICU/IV Team Only) No 02/09/2017  9:27 AM  Dressing Intervention New dressing 02/09/2017  9:27 AM  Dressing Change Due 02/16/17 02/09/2017  9:27 AM       Ashur Glatfelter, Terrilee FilesLioba Maria 02/09/2017, 9:29 AM

## 2017-02-09 NOTE — Progress Notes (Signed)
PHARMACY CONSULT NOTE FOR:  OUTPATIENT  PARENTERAL ANTIBIOTIC THERAPY (OPAT)  Indication: Staph epi right elbow deep infection Regimen: Vanc 750mg  IV Q12H End date: 03/05/17  IV antibiotic discharge orders are pended. To discharging provider:  please sign these orders via discharge navigator,  Select New Orders & click on the button choice - Manage This Unsigned Work.     Thank you for allowing pharmacy to be a part of this patient's care.   Lynnel Zanetti D. Laney Potashang, PharmD, BCPS Pager:  [phone] 02/09/2017, 12:21 PM

## 2017-02-09 NOTE — Care Management Note (Signed)
Case Management Note  Patient Details  Name: Marie Juarez MRN: [id number] Date of Birth: 08/06/1956  Subjective/Objective:                   IRRIGATION AND DEBRIDEMENT ELBOW and HARDWARE REMOVAL (Right) Action/Plan: Discharge planning Expected Discharge Date:  02/09/17               Expected Discharge Plan:  Home w Home Health Services  In-House Referral:     Discharge planning Services  CM Consult  Post Acute Care Choice:  Home Health Choice offered to:  Patient  DME Arranged:  IV pump/equipment DME Agency:  [facility]  [facility]  Status of Service:  Completed, signed off  If discussed at Long Length of Stay Meetings, dates discussed:    Additional Comments: CM spoke with pt in room to offer choice of home health agency. [facility] and PICC line care.  Pt states her son can help with IV runs.  [facility] Jermaine who states he already has the prescription and start of care can begin this evening at 20:00.  CM verified the address and contact number with pt and pt verbalized understanding [facility] will begin the home IV ABX this evening.  No other CM needs were communicated.   Yves DillJeffries, Eluterio Seymour Christine, RN 02/09/2017, 12:32 PM

## 2017-02-09 NOTE — Discharge Summary (Signed)
Patient ID: Marie Juarez MRN: [id number] DOB/AGE: 18-Dec-1955 61 y.o.  Admit date: 02/06/2017 Discharge date: 02/09/2017  Admission Diagnoses:  Active Problems:   Postoperative wound infection   Postoperative infection   Discharge Diagnoses:  Same  Past Medical History:  Diagnosis Date  . Anxiety   . Arthritis    "knees, hips" (02/06/2017)  . Bulging lumbar disc   . Chronic lower back pain   . Edema   . Fracture dislocation of right elbow joint   . GERD (gastroesophageal reflux disease)   . Hyperlipidemia   . Hypertension   . 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 Shortness of breath    with exertion    Surgeries: Procedure(s): IRRIGATION AND DEBRIDEMENT ELBOW and HARDWARE REMOVAL on 02/06/2017   Consultants:   Discharged Condition: Improved  Hospital Course: NEL STONEKING is an 61 y.o. female who was admitted 02/06/2017 for operative treatment of<principal problem not specified>. Patient has severe unremitting pain that affects sleep, daily activities, and work/hobbies. After pre-op clearance the patient was taken to the operating room on 02/06/2017 and underwent  Procedure(s): IRRIGATION AND DEBRIDEMENT ELBOW and HARDWARE REMOVAL.    Patient was given perioperative antibiotics: Anti-infectives    Start     Dose/Rate Route Frequency Ordered Stop   02/09/17 0800  vancomycin (VANCOCIN) IVPB 1000 mg/200 mL premix     1,000 mg 200 mL/hr over 60 Minutes Intravenous Every 12 hours 02/08/17 1717     02/09/17 0000  vancomycin 1,000 mg in sodium chloride 0.9 % 250 mL     1,000 mg 250 mL/hr over 60 Minutes Intravenous Every 12 hours 02/09/17 0929     02/07/17 0600  clindamycin (CLEOCIN) IVPB 600 mg  Status:  Discontinued     600 mg 100 mL/hr over 30 Minutes Intravenous Every 6 hours 02/06/17 2338 02/07/17 1429   02/07/17 0030  Gentamicin  in sodium chloride 0.9% 1000 ml (IRRIGATION)     10 mL/hr  Irrigation Continuous 02/06/17 2345     02/07/17 0000  vancomycin  (VANCOCIN) IVPB 1000 mg/200 mL premix  Status:  Discontinued     1,000 mg 200 mL/hr over 60 Minutes Intravenous Every 8 hours 02/06/17 2349 02/08/17 1715       Patient was given sequential compression devices, early ambulation, and chemoprophylaxis to prevent DVT.  Patient benefited maximally from hospital stay and there were no complications.    Recent vital signs: Patient Vitals for the past 24 hrs:  BP Temp Temp src Pulse Resp SpO2  02/09/17 0424 118/62 98.2 F (36.8 C) Oral 78 18 93 %  02/08/17 2140 122/64 98.3 F (36.8 C) Oral - 17 92 %  02/08/17 1325 114/65 98.5 F (36.9 C) Oral 98 18 90 %     Recent laboratory studies:  Recent Labs  02/06/17 1613 02/08/17 0454  WBC 10.0  --   HGB 13.8  --   HCT 45.5  --   PLT 183  --   NA 139 142  K 4.3 4.1  CL 97* 103  CO2 31 32  BUN 5* 8  CREATININE 0.81 0.84  GLUCOSE 92 97  CALCIUM 8.5* 8.4*     Discharge Medications:   Allergies as of 02/09/2017      Reactions   Keflex [cephalexin] Hives, Itching      Medication List    TAKE these medications   ALPRAZolam 0.5 MG tablet Commonly known as:  XANAX Take 0.5 mg by mouth 3 (  three) times daily.   amLODipine 5 MG tablet Commonly known as:  NORVASC Take 5 mg by mouth daily.   cholecalciferol 1000 units tablet Commonly known as:  VITAMIN D Take 1,000 Units by mouth daily.   furosemide 20 MG tablet Commonly known as:  LASIX Take 20 mg by mouth daily.   gabapentin 800 MG tablet Commonly known as:  NEURONTIN Take 800 mg by mouth 4 (four) times daily.   HYDROcodone-acetaminophen 10-325 MG tablet Commonly known as:  NORCO Take 1 tablet by mouth 4 (four) times daily as needed for moderate pain. What changed:  Another medication with the same name was removed. Continue taking this medication, and follow the directions you see here.   nabumetone 750 MG tablet Commonly known as:  RELAFEN Take 750 mg by mouth 2 (two) times daily.   oxyCODONE 5 MG immediate release  tablet Commonly known as:  ROXICODONE Take 1 tablet (5 mg total) by mouth every 6 (six) hours as needed for severe pain or breakthrough pain. What changed:  reasons to take this   simvastatin 40 MG tablet Commonly known as:  ZOCOR Take 40 mg by mouth at bedtime.   vancomycin 1,000 mg in sodium chloride 0.9 % 250 mL Inject 1,000 mg into the vein every 12 (twelve) hours.   vitamin C 1000 MG tablet Take 1,000 mg by mouth daily.       Diagnostic Studies: No results found.  Disposition: 01-Home or Self Care  Discharge Instructions    Call MD / Call 911    Complete by:  As directed    If you experience chest pain or shortness of breath, CALL 911 and be transported to the hospital emergency room.  If you develope a fever above 101 F, pus (white drainage) or increased drainage or redness at the wound, or calf pain, call your surgeon's office.   Constipation Prevention    Complete by:  As directed    Drink plenty of fluids.  Prune juice may be helpful.  You may use a stool softener, such as Colace (over the counter) 100 mg twice a day.  Use MiraLax (over the counter) for constipation as needed.   Diet - low sodium heart healthy    Complete by:  As directed    Driving restrictions    Complete by:  As directed    No driving for 2 weeks   Increase activity slowly as tolerated    Complete by:  As directed    Patient may shower    Complete by:  As directed    You may shower without a dressing once there is no drainage.  Do not wash over the wound.  If drainage remains, cover wound with plastic wrap and then shower.      Follow-up Information    Schedule an appointment as soon as possible for a visit with Jodi Marble., MD.   Specialty:  Orthopedic Surgery Why:  for approximately 2 weeks from 02-06-17 Contact information: [address] [place] [address] [phone]            Signed: Camala Talwar R 02/09/2017, 9:29 AM

## 2017-02-09 NOTE — Anesthesia Postprocedure Evaluation (Signed)
Anesthesia Post Note  [name]  Procedure(s) Performed: Procedure(s) (LRB): IRRIGATION AND DEBRIDEMENT ELBOW and HARDWARE REMOVAL (Right)  Patient location during evaluation: PACU Anesthesia Type: General Level of consciousness: sedated and patient cooperative Pain management: pain level controlled Vital Signs Assessment: post-procedure vital signs reviewed and stable Respiratory status: spontaneous breathing Cardiovascular status: stable Anesthetic complications: no       Last Vitals:  Vitals:   02/09/17 0424 02/09/17 1211  BP: 118/62 (!) 149/67  Pulse: 78 80  Resp: 18   Temp: 36.8 C 37 C    Last Pain:  Vitals:   02/09/17 1455  TempSrc:   PainSc: 7                  Lewie LoronJohn Aydan Phoenix

## 2017-02-09 NOTE — Progress Notes (Signed)
D/c with family home pain 6/10.  D/c and rx's given and explained with understanding

## 2017-02-11 LAB — AEROBIC/ANAEROBIC CULTURE (SURGICAL/DEEP WOUND)

## 2017-02-11 LAB — AEROBIC/ANAEROBIC CULTURE W GRAM STAIN (SURGICAL/DEEP WOUND)

## 2017-02-13 ENCOUNTER — Encounter: Payer: Self-pay | Admitting: Internal Medicine

## 2017-02-17 ENCOUNTER — Encounter: Payer: Self-pay | Admitting: Internal Medicine

## 2017-02-20 ENCOUNTER — Encounter (HOSPITAL_COMMUNITY): Payer: Self-pay | Admitting: Emergency Medicine

## 2017-02-20 ENCOUNTER — Emergency Department (HOSPITAL_COMMUNITY)
Admission: EM | Admit: 2017-02-20 | Discharge: 2017-02-20 | Disposition: A | Discharge status: Home / Self Care | Payer: Medicare Other | Attending: Emergency Medicine | Admitting: Emergency Medicine

## 2017-02-20 ENCOUNTER — Encounter: Payer: Self-pay | Admitting: Internal Medicine

## 2017-02-20 DIAGNOSIS — Y939 Activity, unspecified: Secondary | ICD-10-CM | POA: Diagnosis not present

## 2017-02-20 DIAGNOSIS — T829XXA Unspecified complication of cardiac and vascular prosthetic device, implant and graft, initial encounter: Secondary | ICD-10-CM

## 2017-02-20 DIAGNOSIS — Y929 Unspecified place or not applicable: Secondary | ICD-10-CM | POA: Insufficient documentation

## 2017-02-20 DIAGNOSIS — S61202A Unspecified open wound of right middle finger without damage to nail, initial encounter: Secondary | ICD-10-CM | POA: Insufficient documentation

## 2017-02-20 DIAGNOSIS — W268XXA Contact with other sharp object(s), not elsewhere classified, initial encounter: Secondary | ICD-10-CM | POA: Insufficient documentation

## 2017-02-20 DIAGNOSIS — S61209A Unspecified open wound of unspecified finger without damage to nail, initial encounter: Secondary | ICD-10-CM

## 2017-02-20 DIAGNOSIS — Z79899 Other long term (current) drug therapy: Secondary | ICD-10-CM | POA: Diagnosis not present

## 2017-02-20 DIAGNOSIS — T80219A Unspecified infection due to central venous catheter, initial encounter: Secondary | ICD-10-CM | POA: Insufficient documentation

## 2017-02-20 DIAGNOSIS — Y69 Unspecified misadventure during surgical and medical care: Secondary | ICD-10-CM | POA: Insufficient documentation

## 2017-02-20 DIAGNOSIS — Y999 Unspecified external cause status: Secondary | ICD-10-CM | POA: Insufficient documentation

## 2017-02-20 DIAGNOSIS — I10 Essential (primary) hypertension: Secondary | ICD-10-CM | POA: Insufficient documentation

## 2017-02-20 DIAGNOSIS — F17210 Nicotine dependence, cigarettes, uncomplicated: Secondary | ICD-10-CM | POA: Diagnosis not present

## 2017-02-20 LAB — CBC WITH DIFFERENTIAL/PLATELET
BASOS ABS: 0 10*3/uL (ref 0.0–0.1)
BASOS PCT: 0 %
Eosinophils Absolute: 0.3 10*3/uL (ref 0.0–0.7)
Eosinophils Relative: 3 %
HEMATOCRIT: 43.2 % (ref 36.0–46.0)
HEMOGLOBIN: 13 g/dL (ref 12.0–15.0)
LYMPHS PCT: 15 %
Lymphs Abs: 1.3 10*3/uL (ref 0.7–4.0)
MCH: 26.9 pg (ref 26.0–34.0)
MCHC: 30.1 g/dL (ref 30.0–36.0)
MCV: 89.4 fL (ref 78.0–100.0)
MONO ABS: 0.6 10*3/uL (ref 0.1–1.0)
MONOS PCT: 8 %
NEUTROS ABS: 6.2 10*3/uL (ref 1.7–7.7)
NEUTROS PCT: 74 %
Platelets: 155 10*3/uL (ref 150–400)
RBC: 4.83 MIL/uL (ref 3.87–5.11)
RDW: 16.7 % — AB (ref 11.5–15.5)
WBC: 8.4 10*3/uL (ref 4.0–10.5)

## 2017-02-20 LAB — COMPREHENSIVE METABOLIC PANEL
ALBUMIN: 3.4 g/dL — AB (ref 3.5–5.0)
ALK PHOS: 76 U/L (ref 38–126)
ALT: 11 U/L — ABNORMAL LOW (ref 14–54)
AST: 15 U/L (ref 15–41)
Anion gap: 6 (ref 5–15)
BILIRUBIN TOTAL: 0.9 mg/dL (ref 0.3–1.2)
BUN: 12 mg/dL (ref 6–20)
CALCIUM: 8.4 mg/dL — AB (ref 8.9–10.3)
CO2: 34 mmol/L — ABNORMAL HIGH (ref 22–32)
CREATININE: 0.9 mg/dL (ref 0.44–1.00)
Chloride: 98 mmol/L — ABNORMAL LOW (ref 101–111)
GFR calc Af Amer: >60 mL/min (ref >60)
GLUCOSE: 120 mg/dL — AB (ref 65–99)
Potassium: 4.4 mmol/L (ref 3.5–5.1)
Sodium: 138 mmol/L (ref 135–145)
TOTAL PROTEIN: 6.5 g/dL (ref 6.5–8.1)

## 2017-02-20 NOTE — Discharge Instructions (Signed)
As discussed, it is important that you monitor your condition carefully, be sure to follow-up with your physicians, and do not hesitate to return here for concerning changes.

## 2017-02-20 NOTE — ED Provider Notes (Signed)
[facility] DEPT Provider Note   CSN: [id number] Arrival date & time: 02/20/17  1103   By signing my name below, I, Marie Juarez, attest that this documentation has been prepared under the direction and in the presence of Carmin Muskrat, MD. Electronically Signed: Hilbert Juarez, Scribe. 02/20/17. 1:21 PM. History   Chief Complaint Chief Complaint  Patient presents with  . Vascular Access Problem  . Leg Swelling    The history is provided by the patient. No language interpreter was used.  HPI Comments: Marie Juarez is a 61 y.o. female who presents to the Emergency Department complaining of leg swelling with some erythema that was noted today. The patient had a fall in December and had a right elbow open fracture and dislocation. An infection began to develop so a PICC line was placed. The patient was seen yesterday by her orthopedic and was scheduled for an appointment on 03/18/2017. The patient was seen today by her home health nurse for a PICC line dressing change when the nurse noted some redness around the area of insertion. The patient was advised to come to the ED for evaluation. While en route to the ED the patient cut the tip of her left middle finger with an envelope opener. She denies any fevers, CP, vomiting, and nausea recently. She also denies any pain or problems with her PICC Line.  Past Medical History:  Diagnosis Date  . Anxiety   . Arthritis    "knees, hips" (02/06/2017)  . Bulging lumbar disc   . Chronic lower back pain   . Edema   . Fracture dislocation of right elbow joint   . GERD (gastroesophageal reflux disease)   . Hyperlipidemia   . Hypertension   . 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 Shortness of breath    with exertion    Patient Active Problem List   Diagnosis Date Noted  . Postoperative wound infection 02/06/2017  . Postoperative infection 02/06/2017    Past Surgical History:  Procedure Laterality Date  . CESAREAN SECTION  1989  .  FRACTURE SURGERY    . HEEL SPUR SURGERY Left 2002  . I&D EXTREMITY Right 02/06/2017   Procedure: IRRIGATION AND DEBRIDEMENT ELBOW and HARDWARE REMOVAL;  Surgeon: Milly Jakob, MD;  Location: [facility];  Service: Orthopedics;  Laterality: Right;  . ORIF ELBOW FRACTURE Right 12/19/2016   Procedure: OPEN TREATMENT OF RIGHT ELBOW-DISLOCATION FRACTURE;  Surgeon: Milly Jakob, MD;  Location: [facility];  Service: Orthopedics;  Laterality: Right;  GENERAL ANESTHESIA WITH PRE-OP BLOCK  . TARSAL TUNNEL RELEASE Left 2003; 2004    OB History    Gravida Para Term Preterm AB Living   '1 1       1   '$ SAB TAB Ectopic Multiple Live Births                   Home Medications    Prior to Admission medications   Medication Sig Start Date End Date Taking? Authorizing Provider  ALPRAZolam Duanne Moron) 0.5 MG tablet Take 0.5 mg by mouth 3 (three) times daily.   Yes Historical Provider, MD  amLODipine (NORVASC) 5 MG tablet Take 5 mg by mouth daily.   Yes Historical Provider, MD  Ascorbic Acid (VITAMIN C) 1000 MG tablet Take 1,000 mg by mouth daily.   Yes Historical Provider, MD  cholecalciferol (VITAMIN D) 1000 units tablet Take 1,000 Units by mouth daily.   Yes Historical Provider, MD  furosemide (LASIX) 20 MG  tablet Take 20 mg by mouth daily.   Yes Historical Provider, MD  gabapentin (NEURONTIN) 800 MG tablet Take 800 mg by mouth 4 (four) times daily.   Yes Historical Provider, MD  HYDROcodone-acetaminophen (NORCO) 10-325 MG tablet Take 1 tablet by mouth 4 (four) times daily as needed for moderate pain.   Yes Historical Provider, MD  nabumetone (RELAFEN) 750 MG tablet Take 750 mg by mouth 2 (two) times daily.   Yes Historical Provider, MD  simvastatin (ZOCOR) 40 MG tablet Take 40 mg by mouth at bedtime.    Yes Historical Provider, MD  vancomycin IVPB Inject 750 mg into the vein every 12 (twelve) hours. Indication:  Staph epi right elbow deep infxn Last Day of Therapy:  03/05/17 Labs - Sunday/Monday:  CBC/D, BMP, and  vancomycin trough. Labs - Thursday:  BMP and vancomycin trough Labs - Every other week:  ESR and CRP 02/09/17 03/05/17 Yes Eric Callie Fielding, PA-C  oxyCODONE (ROXICODONE) 5 MG immediate release tablet Take 1 tablet (5 mg total) by mouth every 6 (six) hours as needed for severe pain or breakthrough pain. Patient not taking: Reported on 02/20/2017 02/06/17   Milly Jakob, MD    Family History Family History  Problem Relation Age of Onset  . Diabetes Mother   . Hypertension Mother   . Stroke Father   . Diabetes Sister   . Hypertension Sister   . Diabetes Brother     borderline  . Diabetes Maternal Grandmother   . Hypertension Maternal Grandmother   . Stroke Maternal Grandfather   . Diabetes Brother   . Diabetes Brother     Social History Social History  Substance Use Topics  . Smoking status: Current Every Day Smoker    Packs/day: 1.50    Years: 37.00    Types: Cigarettes  . Smokeless tobacco: Never Used  . Alcohol use No     Allergies   Keflex [cephalexin]   Review of Systems Review of Systems  Constitutional: Negative for fever.  Cardiovascular: Positive for leg swelling. Negative for chest pain.  Gastrointestinal: Negative for nausea and vomiting.  Skin: Positive for color change (Erythema in both ankles) and wound (Laceration on left middle finger).  All other systems reviewed and are negative.    Physical Exam Updated Vital Signs BP (!) 108/42   Pulse 101   Temp 98.6 F (37 C) (Oral)   Resp 21   Ht '5\' 2"'$  (1.575 m)   Wt 210 lb (95.3 kg)   SpO2 91%   BMI 38.41 kg/m   Physical Exam  Constitutional: She is oriented to person, place, and time. She appears well-developed and well-nourished. No distress.  HENT:  Head: Normocephalic and atraumatic.  Eyes: EOM are normal.  Neck: Normal range of motion.  Cardiovascular: Normal rate, regular rhythm and normal heart sounds.   Pulmonary/Chest: Effort normal and breath sounds normal.  Abdominal: Soft. She  exhibits no distension. There is no tenderness.  Musculoskeletal: Normal range of motion.  Neurological: She is alert and oriented to person, place, and time.  Skin: Skin is warm and dry. There is erythema.     Bilateral erythema in both ankles.  Psychiatric: She has a normal mood and affect. Judgment normal.  Nursing note and vitals reviewed.      ED Treatments / Results  DIAGNOSTIC STUDIES: Oxygen Saturation is 91% on RA, low by my interpretation.    COORDINATION OF CARE: 12:44 PM Discussed treatment plan with pt at bedside and pt agreed  to plan. I will check the patient's labs.  Labs (all labs ordered are listed, but only abnormal results are displayed) Labs Reviewed  CBC WITH DIFFERENTIAL/PLATELET - Abnormal; Notable for the following:       Result Value   RDW 16.7 (*)    All other components within normal limits  COMPREHENSIVE METABOLIC PANEL - Abnormal; Notable for the following:    Chloride 98 (*)    CO2 34 (*)    Glucose, Bld 120 (*)    Calcium 8.4 (*)    Albumin 3.4 (*)    ALT 11 (*)    All other components within normal limits  CULTURE, BLOOD (ROUTINE X 2)  CULTURE, BLOOD (ROUTINE X 2)    After the initial evaluation I discussed the patient's presentation with our infectious disease colleagues. With reassuring labs, vitals, only minor erythema around the insertion of the PICC line, no evidence for systemic illness, patient will continue to use IV antibiotics, with close monitoring.   LACERATION REPAIR Performed by: Carmin Muskrat Authorized by: Carmin Muskrat Consent: Verbal consent obtained. Risks and benefits: risks, benefits and alternatives were discussed Consent given by: patient Patient identity confirmed: provided demographic data Prepped and Draped in normal sterile fashion Wound explored  Laceration Location: Left distal third digit, avulsion of skin/partial amputation  Laceration Length: Distal finger pad, roughly 4 cm circumference of  the circular area  No Foreign Bodies seen or palpated  Irrigation method: syringe Amount of cleaning: standard  Skin closure: Dermabond After a local tourniquet was applied to the finger for hemostasis,   Number of Dermabond containers, one   Technique: Appropriate hemostasis achieved  Patient tolerance: Patient tolerated the procedure well with no immediate complications.  Procedures Procedures (including critical care time)   Initial Impression / Assessment and Plan / ED Course  I have reviewed the triage vital signs and the nursing notes.  Pertinent labs & imaging results that were available during my care of the patient were reviewed by me and considered in my medical decision making (see chart for details.)  Patient presents 2 weeks after initiating IV therapy following provision, irrigation of prior fracture dislocation of the right elbow. Here the patient has some minor erythema around the insertion of her PICC line site, but no evidence for systemic infection. With other areas of similar erythema, there is some suspicion for irritation, less evidence for infection, no evidence for bacteremia, sepsis. After discussion with our infectious disease colleagues, the patient will continue IV antibiotics, with close monitoring, which she understands is important. Patient also has distal fingertip amputation, which was appropriately treated, without complication.   Final Clinical Impressions(s) / ED Diagnoses  Avulsion of fingertip Concern for central line complication   I personally performed the services described in this documentation, which was scribed in my presence. The recorded information has been reviewed and is accurate.       Carmin Muskrat, MD 02/20/17 [phone]

## 2017-02-20 NOTE — ED Triage Notes (Addendum)
PT states her home health nurse came out today to do a PICC line drsg change and noted the area of insertion to be red. PT denies any pain or complications from PICC line. PT also states she cute the tip of her left hand middle finger on a envelope opener in her pocketbook on the way to the ED today. Redness and swelling also noted to bilateral lower legs.

## 2017-02-24 ENCOUNTER — Encounter: Payer: Self-pay | Admitting: Internal Medicine

## 2017-02-25 LAB — CULTURE, BLOOD (ROUTINE X 2)
CULTURE: NO GROWTH
Culture: NO GROWTH

## 2017-02-28 ENCOUNTER — Other Ambulatory Visit (HOSPITAL_COMMUNITY)
Admission: RE | Admit: 2017-02-28 | Discharge: 2017-02-28 | Disposition: A | Discharge status: Home / Self Care | Payer: Medicare Other | Source: Other Acute Inpatient Hospital | Attending: Orthopedic Surgery | Admitting: Orthopedic Surgery

## 2017-02-28 DIAGNOSIS — Z029 Encounter for administrative examinations, unspecified: Secondary | ICD-10-CM | POA: Insufficient documentation

## 2017-02-28 LAB — BASIC METABOLIC PANEL
ANION GAP: 9 (ref 5–15)
BUN: 11 mg/dL (ref 6–20)
CHLORIDE: 96 mmol/L — AB (ref 101–111)
CO2: 33 mmol/L — AB (ref 22–32)
Calcium: 8.8 mg/dL — ABNORMAL LOW (ref 8.9–10.3)
Creatinine, Ser: 0.97 mg/dL (ref 0.44–1.00)
GFR calc Af Amer: >60 mL/min (ref >60)
GFR calc non Af Amer: >60 mL/min (ref >60)
GLUCOSE: 95 mg/dL (ref 65–99)
POTASSIUM: 4.9 mmol/L (ref 3.5–5.1)
Sodium: 138 mmol/L (ref 135–145)

## 2017-02-28 LAB — VANCOMYCIN, TROUGH: Vancomycin Tr: 19 ug/mL (ref 15–20)

## 2017-03-03 ENCOUNTER — Encounter: Payer: Self-pay | Admitting: Internal Medicine

## 2017-03-03 ENCOUNTER — Emergency Department (HOSPITAL_COMMUNITY): Payer: Medicare Other

## 2017-03-03 ENCOUNTER — Encounter (HOSPITAL_COMMUNITY): Payer: Self-pay | Admitting: Emergency Medicine

## 2017-03-03 ENCOUNTER — Inpatient Hospital Stay (HOSPITAL_COMMUNITY)
Admission: EM | Admit: 2017-03-03 | Discharge: 2017-03-13 | DRG: 291 | Disposition: A | Discharge status: Home Health | Payer: Medicare Other | Attending: Internal Medicine | Admitting: Internal Medicine

## 2017-03-03 DIAGNOSIS — J9601 Acute respiratory failure with hypoxia: Secondary | ICD-10-CM | POA: Diagnosis present

## 2017-03-03 DIAGNOSIS — F419 Anxiety disorder, unspecified: Secondary | ICD-10-CM | POA: Diagnosis present

## 2017-03-03 DIAGNOSIS — D696 Thrombocytopenia, unspecified: Secondary | ICD-10-CM | POA: Diagnosis present

## 2017-03-03 DIAGNOSIS — N179 Acute kidney failure, unspecified: Secondary | ICD-10-CM | POA: Diagnosis present

## 2017-03-03 DIAGNOSIS — F17210 Nicotine dependence, cigarettes, uncomplicated: Secondary | ICD-10-CM | POA: Diagnosis present

## 2017-03-03 DIAGNOSIS — I110 Hypertensive heart disease with heart failure: Secondary | ICD-10-CM | POA: Diagnosis present

## 2017-03-03 DIAGNOSIS — I361 Nonrheumatic tricuspid (valve) insufficiency: Secondary | ICD-10-CM | POA: Diagnosis present

## 2017-03-03 DIAGNOSIS — R601 Generalized edema: Secondary | ICD-10-CM | POA: Diagnosis not present

## 2017-03-03 DIAGNOSIS — I509 Heart failure, unspecified: Secondary | ICD-10-CM

## 2017-03-03 DIAGNOSIS — J9602 Acute respiratory failure with hypercapnia: Secondary | ICD-10-CM | POA: Diagnosis present

## 2017-03-03 DIAGNOSIS — Z792 Long term (current) use of antibiotics: Secondary | ICD-10-CM | POA: Diagnosis not present

## 2017-03-03 DIAGNOSIS — J449 Chronic obstructive pulmonary disease, unspecified: Secondary | ICD-10-CM | POA: Diagnosis present

## 2017-03-03 DIAGNOSIS — E785 Hyperlipidemia, unspecified: Secondary | ICD-10-CM | POA: Diagnosis present

## 2017-03-03 DIAGNOSIS — I340 Nonrheumatic mitral (valve) insufficiency: Secondary | ICD-10-CM | POA: Diagnosis present

## 2017-03-03 DIAGNOSIS — Z833 Family history of diabetes mellitus: Secondary | ICD-10-CM

## 2017-03-03 DIAGNOSIS — L539 Erythematous condition, unspecified: Secondary | ICD-10-CM | POA: Diagnosis not present

## 2017-03-03 DIAGNOSIS — I5020 Unspecified systolic (congestive) heart failure: Secondary | ICD-10-CM | POA: Diagnosis present

## 2017-03-03 DIAGNOSIS — I5031 Acute diastolic (congestive) heart failure: Secondary | ICD-10-CM | POA: Diagnosis present

## 2017-03-03 DIAGNOSIS — G8929 Other chronic pain: Secondary | ICD-10-CM | POA: Diagnosis present

## 2017-03-03 DIAGNOSIS — L740 Miliaria rubra: Secondary | ICD-10-CM | POA: Diagnosis not present

## 2017-03-03 DIAGNOSIS — Z8249 Family history of ischemic heart disease and other diseases of the circulatory system: Secondary | ICD-10-CM | POA: Diagnosis not present

## 2017-03-03 DIAGNOSIS — M545 Low back pain: Secondary | ICD-10-CM | POA: Diagnosis present

## 2017-03-03 DIAGNOSIS — Z716 Tobacco abuse counseling: Secondary | ICD-10-CM | POA: Diagnosis not present

## 2017-03-03 DIAGNOSIS — S42401D Unspecified fracture of lower end of right humerus, subsequent encounter for fracture with routine healing: Secondary | ICD-10-CM | POA: Diagnosis not present

## 2017-03-03 DIAGNOSIS — K219 Gastro-esophageal reflux disease without esophagitis: Secondary | ICD-10-CM | POA: Diagnosis present

## 2017-03-03 DIAGNOSIS — Z79899 Other long term (current) drug therapy: Secondary | ICD-10-CM

## 2017-03-03 DIAGNOSIS — Z881 Allergy status to other antibiotic agents status: Secondary | ICD-10-CM

## 2017-03-03 LAB — BLOOD GAS, ARTERIAL
ACID-BASE EXCESS: 7.3 mmol/L — AB (ref 0.0–2.0)
Bicarbonate: 28.8 mmol/L — ABNORMAL HIGH (ref 20.0–28.0)
Drawn by: 270161
O2 Content: 3 L/min
O2 Saturation: 83.8 %
PCO2 ART: 70.6 mmHg — AB (ref 32.0–48.0)
PH ART: 7.298 — AB (ref 7.350–7.450)
PO2 ART: 55.6 mmHg — AB (ref 83.0–108.0)
Patient temperature: 98.5

## 2017-03-03 LAB — BASIC METABOLIC PANEL
Anion gap: 9 (ref 5–15)
BUN: 20 mg/dL (ref 6–20)
CALCIUM: 8.7 mg/dL — AB (ref 8.9–10.3)
CHLORIDE: 93 mmol/L — AB (ref 101–111)
CO2: 33 mmol/L — AB (ref 22–32)
Creatinine, Ser: 1.25 mg/dL — ABNORMAL HIGH (ref 0.44–1.00)
GFR calc Af Amer: 53 mL/min — ABNORMAL LOW (ref >60)
GFR calc non Af Amer: 46 mL/min — ABNORMAL LOW (ref >60)
Glucose, Bld: 136 mg/dL — ABNORMAL HIGH (ref 65–99)
Potassium: 5 mmol/L (ref 3.5–5.1)
Sodium: 135 mmol/L (ref 135–145)

## 2017-03-03 LAB — CBC WITH DIFFERENTIAL/PLATELET
Basophils Absolute: 0 10*3/uL (ref 0.0–0.1)
Basophils Relative: 0 %
Eosinophils Absolute: 0.1 10*3/uL (ref 0.0–0.7)
Eosinophils Relative: 2 %
HEMATOCRIT: 44.1 % (ref 36.0–46.0)
HEMOGLOBIN: 12.8 g/dL (ref 12.0–15.0)
LYMPHS ABS: 1.2 10*3/uL (ref 0.7–4.0)
Lymphocytes Relative: 17 %
MCH: 25.5 pg — AB (ref 26.0–34.0)
MCHC: 29 g/dL — AB (ref 30.0–36.0)
MCV: 87.8 fL (ref 78.0–100.0)
MONOS PCT: 11 %
Monocytes Absolute: 0.8 10*3/uL (ref 0.1–1.0)
NEUTROS ABS: 5 10*3/uL (ref 1.7–7.7)
NEUTROS PCT: 70 %
Platelets: 144 10*3/uL — ABNORMAL LOW (ref 150–400)
RBC: 5.02 MIL/uL (ref 3.87–5.11)
RDW: 17.8 % — ABNORMAL HIGH (ref 11.5–15.5)
WBC: 7.1 10*3/uL (ref 4.0–10.5)

## 2017-03-03 LAB — URINALYSIS, ROUTINE W REFLEX MICROSCOPIC
BILIRUBIN URINE: NEGATIVE
GLUCOSE, UA: NEGATIVE mg/dL
HGB URINE DIPSTICK: NEGATIVE
Ketones, ur: NEGATIVE mg/dL
Leukocytes, UA: NEGATIVE
NITRITE: NEGATIVE
PROTEIN: 30 mg/dL — AB
SPECIFIC GRAVITY, URINE: 1.019 (ref 1.005–1.030)
pH: 5 (ref 5.0–8.0)

## 2017-03-03 LAB — HEPATIC FUNCTION PANEL
ALT: 12 U/L — AB (ref 14–54)
AST: 28 U/L (ref 15–41)
Albumin: 3.3 g/dL — ABNORMAL LOW (ref 3.5–5.0)
Alkaline Phosphatase: 93 U/L (ref 38–126)
BILIRUBIN INDIRECT: 0.8 mg/dL (ref 0.3–0.9)
Bilirubin, Direct: 0.3 mg/dL (ref 0.1–0.5)
TOTAL PROTEIN: 6.9 g/dL (ref 6.5–8.1)
Total Bilirubin: 1.1 mg/dL (ref 0.3–1.2)

## 2017-03-03 LAB — TROPONIN I
TROPONIN I: 0.03 ng/mL — AB (ref <0.03)
Troponin I: 0.03 ng/mL (ref <0.03)

## 2017-03-03 LAB — BRAIN NATRIURETIC PEPTIDE: B NATRIURETIC PEPTIDE 5: 100 pg/mL (ref 0.0–100.0)

## 2017-03-03 MED ORDER — ASPIRIN EC 81 MG PO TBEC
81.0000 mg | DELAYED_RELEASE_TABLET | Freq: Every day | ORAL | Status: DC
  Administered 2017-03-04 – 2017-03-13 (×10): 81 mg via ORAL
  Filled 2017-03-03 (×10): qty 1

## 2017-03-03 MED ORDER — ALPRAZOLAM 0.5 MG PO TABS
0.5000 mg | ORAL_TABLET | Freq: Three times a day (TID) | ORAL | Status: DC
  Administered 2017-03-03 – 2017-03-13 (×29): 0.5 mg via ORAL
  Filled 2017-03-03 (×29): qty 1

## 2017-03-03 MED ORDER — FUROSEMIDE 10 MG/ML IJ SOLN
40.0000 mg | Freq: Two times a day (BID) | INTRAMUSCULAR | Status: DC
  Administered 2017-03-03 – 2017-03-04 (×2): 40 mg via INTRAVENOUS
  Filled 2017-03-03 (×2): qty 4

## 2017-03-03 MED ORDER — SODIUM CHLORIDE 0.9% FLUSH
3.0000 mL | Freq: Two times a day (BID) | INTRAVENOUS | Status: DC
  Administered 2017-03-03 – 2017-03-13 (×15): 3 mL via INTRAVENOUS

## 2017-03-03 MED ORDER — ACETAMINOPHEN 325 MG PO TABS: 650.0000 mg | ORAL_TABLET | Freq: Four times a day (QID) | ORAL | Status: DC | PRN

## 2017-03-03 MED ORDER — FUROSEMIDE 10 MG/ML IJ SOLN
40.0000 mg | Freq: Once | INTRAMUSCULAR | Status: AC
  Administered 2017-03-03: 40 mg via INTRAVENOUS
  Filled 2017-03-03: qty 4

## 2017-03-03 MED ORDER — ALBUTEROL SULFATE (2.5 MG/3ML) 0.083% IN NEBU
2.5000 mg | INHALATION_SOLUTION | RESPIRATORY_TRACT | Status: DC | PRN
Start: 2017-03-03 — End: 2017-03-13

## 2017-03-03 MED ORDER — ACETAMINOPHEN 650 MG RE SUPP: 650.0000 mg | Freq: Four times a day (QID) | RECTAL | Status: DC | PRN

## 2017-03-03 MED ORDER — VANCOMYCIN HCL IN DEXTROSE 750-5 MG/150ML-% IV SOLN
750.0000 mg | Freq: Two times a day (BID) | INTRAVENOUS | Status: AC
  Administered 2017-03-03 – 2017-03-05 (×5): 750 mg via INTRAVENOUS
  Filled 2017-03-03 (×5): qty 150

## 2017-03-03 MED ORDER — SODIUM CHLORIDE 0.9% FLUSH
3.0000 mL | INTRAVENOUS | Status: DC | PRN
  Administered 2017-03-09: 3 mL via INTRAVENOUS
  Filled 2017-03-03: qty 3

## 2017-03-03 MED ORDER — SODIUM CHLORIDE 0.9 % IV SOLN
250.0000 mL | INTRAVENOUS | Status: DC | PRN
  Administered 2017-03-03: 250 mL via INTRAVENOUS

## 2017-03-03 MED ORDER — SODIUM CHLORIDE 0.9% FLUSH
3.0000 mL | Freq: Two times a day (BID) | INTRAVENOUS | Status: DC
  Administered 2017-03-04 – 2017-03-10 (×9): 3 mL via INTRAVENOUS
  Administered 2017-03-10: 10 mL via INTRAVENOUS
  Administered 2017-03-11 – 2017-03-12 (×2): 3 mL via INTRAVENOUS

## 2017-03-03 MED ORDER — CHLORHEXIDINE GLUCONATE CLOTH 2 % EX PADS
6.0000 | MEDICATED_PAD | Freq: Every day | CUTANEOUS | Status: DC
  Administered 2017-03-04 – 2017-03-11 (×6): 6 via TOPICAL

## 2017-03-03 MED ORDER — SIMVASTATIN 20 MG PO TABS
40.0000 mg | ORAL_TABLET | Freq: Every day | ORAL | Status: DC
  Administered 2017-03-03 – 2017-03-12 (×10): 40 mg via ORAL
  Filled 2017-03-03 (×10): qty 2

## 2017-03-03 MED ORDER — GABAPENTIN 400 MG PO CAPS
800.0000 mg | ORAL_CAPSULE | Freq: Four times a day (QID) | ORAL | Status: DC
  Administered 2017-03-03 – 2017-03-13 (×37): 800 mg via ORAL
  Filled 2017-03-03 (×38): qty 2

## 2017-03-03 MED ORDER — HYDROCODONE-ACETAMINOPHEN 10-325 MG PO TABS
1.0000 | ORAL_TABLET | Freq: Four times a day (QID) | ORAL | Status: DC | PRN
  Administered 2017-03-05 – 2017-03-13 (×18): 1 via ORAL
  Filled 2017-03-03 (×20): qty 1

## 2017-03-03 NOTE — H&P (Signed)
History and Physical  Marie Juarez Marie Juarez DOB: January 25, 1956 DOA: 03/03/2017  PCP: Jani Gravel, MD  Patient coming from: home  Chief Complaint: short of breath  HPI:  61 year old woman PMH hypertension, hyperlipidemia, lower extremity edema present to the emergency department after being found hypoxic at home by her home health nurse. Initial evaluation revealed acute hypoxic and hypercapnic respiratory failure requiring BiPAP, most likely secondary to CHF based on exam and chest x-ray.  Patient reports that she's felt tired lately and a little short of breath but she has shortness of breath with exertion for the last several months. Does not use oxygen. Smokes 1 pack per day but does not use breathing treatments. No history of heart problems. Home health nurse found her to be hypoxic. Patient reports not being particular asymptomatic with this. No recent fever, cough or infectious symptoms. No chest pain. She does report lower extremity edema which isn't been present for at least 2 months and not improving on diuretic therapy. She was treated with Lasix in the emergency department and is currently on BiPAP and appears comfortable.  ED Course: Treated with Lasix, BiPAP  Pertinent labs: ABG 7.298/70/55, CMP unremarkable, troponin 0.03, BNP 100, Imaging: CXR independently reviewed: Cardiomegaly, pulmonary edema  Review of Systems:  Negative for fever, visual changes, sore throat, rash, new muscle aches, chest pain, dysuria, bleeding, n/v/abdominal pain.  Past Medical History:  Diagnosis Date  . Anxiety   . Arthritis    "knees, hips" (02/06/2017)  . Bulging lumbar disc   . Chronic lower back pain   . Edema   . Fracture dislocation of right elbow joint   . GERD (gastroesophageal reflux disease)   . Hyperlipidemia   . Hypertension   . 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 Shortness of breath    with exertion    Past Surgical History:  Procedure Laterality Date  . CESAREAN SECTION   1989  . FRACTURE SURGERY    . HEEL SPUR SURGERY Left 2002  . I&D EXTREMITY Right 02/06/2017   Procedure: IRRIGATION AND DEBRIDEMENT ELBOW and HARDWARE REMOVAL;  Surgeon: Milly Jakob, MD;  Location: [facility];  Service: Orthopedics;  Laterality: Right;  . ORIF ELBOW FRACTURE Right 12/19/2016   Procedure: OPEN TREATMENT OF RIGHT ELBOW-DISLOCATION FRACTURE;  Surgeon: Milly Jakob, MD;  Location: [facility];  Service: Orthopedics;  Laterality: Right;  GENERAL ANESTHESIA WITH PRE-OP BLOCK  . TARSAL TUNNEL RELEASE Left 2003; 2004     reports that she has been smoking Cigarettes.  She has a 55.50 pack-year smoking history. She has never used smokeless tobacco. She reports that she does not drink alcohol or use drugs.   Allergies  Allergen Reactions  . Keflex [Cephalexin] Hives and Itching    Family History  Problem Relation Age of Onset  . Diabetes Mother   . Hypertension Mother   . Stroke Father   . Diabetes Sister   . Hypertension Sister   . Diabetes Brother     borderline  . Diabetes Maternal Grandmother   . Hypertension Maternal Grandmother   . Stroke Maternal Grandfather   . Diabetes Brother   . Diabetes Brother      Prior to Admission medications   Medication Sig Start Date End Date Taking? Authorizing Provider  ALPRAZolam Duanne Moron) 0.5 MG tablet Take 0.5 mg by mouth 3 (three) times daily.   Yes Historical Provider, MD  amLODipine (NORVASC) 5 MG tablet Take 5 mg by mouth daily.   Yes  Historical Provider, MD  Ascorbic Acid (VITAMIN C) 1000 MG tablet Take 1,000 mg by mouth daily.   Yes Historical Provider, MD  cholecalciferol (VITAMIN D) 1000 units tablet Take 1,000 Units by mouth daily.   Yes Historical Provider, MD  furosemide (LASIX) 20 MG tablet Take 20 mg by mouth daily.   Yes Historical Provider, MD  gabapentin (NEURONTIN) 800 MG tablet Take 800 mg by mouth 4 (four) times daily.   Yes Historical Provider, MD  HYDROcodone-acetaminophen (NORCO) 10-325 MG tablet Take 1 tablet by  mouth 4 (four) times daily as needed for moderate pain.   Yes Historical Provider, MD  nabumetone (RELAFEN) 750 MG tablet Take 750 mg by mouth 2 (two) times daily.   Yes Historical Provider, MD  simvastatin (ZOCOR) 40 MG tablet Take 40 mg by mouth at bedtime.    Yes Historical Provider, MD  vancomycin IVPB Inject 750 mg into the vein every 12 (twelve) hours. Indication:  Staph epi right elbow deep infxn Last Day of Therapy:  03/05/17 Labs - Sunday/Monday:  CBC/D, BMP, and vancomycin trough. Labs - Thursday:  BMP and vancomycin trough Labs - Every other week:  ESR and CRP 02/09/17 03/05/17 Yes Leighton Parody, PA-C    Physical Exam: Vitals:   03/03/17 1230 03/03/17 1236 03/03/17 1330 03/03/17 1500  BP: 117/73  108/63 111/74  Pulse:  75 82 77  Resp: '10 11 12 '$ (!) 9  Temp:      SpO2:  96% 99% 97%  Weight:      Height:       Constitutional:  . Appears comfortable and BiPAP. Calm. Eyes:  . Pupils, irises, lids appear grossly normal. ENMT:  . Grossly normal hearing. Lips and tongue appear unremarkable. Neck:  . No lymphadenopathy or masses noted . no thyromegaly Respiratory:  . CTA bilaterally, no w/r/r.  . Moderate increased respiratory effort. Able to speak in short sentences. Cardiovascular:  . RRR, no m/r/g . 4+ bilateral lower extremity edema all the way up to the abdomen. 2+ left arm edema. Abdomen:  . Soft, nontender, obese . No hernia noted Musculoskeletal:  . Digits/nails hands: no clubbing, cyanosis, petechiae, infection . LUE, RLE, LLE   o strength and tone appear grossly normal, no atrophy, no abnormal movements o No tenderness, masses . Right upper extremity ace bandage around elbow, appears deformed Skin:  . No rashes, lesions, ulcers . palpation of skin: no induration or nodules Psychiatric:  . judgement and insight appear normal . Mental status o Mood, affect appropriate  Wt Readings from Last 3 Encounters:  03/03/17 95.3 kg (210 lb)  02/20/17 95.3 kg (210  lb)  02/06/17 95.6 kg (210 lb 12.8 oz)    I have personally reviewed following labs and imaging studies  Labs on Admission:  CBC:  Recent Labs Lab 03/03/17 1133  WBC 7.1  NEUTROABS 5.0  HGB 12.8  HCT 44.1  MCV 87.8  PLT 035*   Basic Metabolic Panel:  Recent Labs Lab 02/28/17 1330 03/03/17 1133  NA 138 135  K 4.9 5.0  CL 96* 93*  CO2 33* 33*  GLUCOSE 95 136*  BUN 11 20  CREATININE 0.97 1.25*  CALCIUM 8.8* 8.7*   Liver Function Tests:  Recent Labs Lab 03/03/17 1133  AST 28  ALT 12*  ALKPHOS 93  BILITOT 1.1  PROT 6.9  ALBUMIN 3.3*   Cardiac Enzymes:  Recent Labs Lab 03/03/17 1133  TROPONINI 0.03*     Radiological Exams on Admission: Dg Chest  Portable 1 View  Result Date: 03/03/2017 CLINICAL DATA:  Increase shortness of breath and hypoxia. EXAM: PORTABLE CHEST 1 VIEW COMPARISON:  None. FINDINGS: The heart is enlarged. A diffuse interstitial pattern is present. The right hemidiaphragm is elevated. Bibasilar airspace disease is worse on the right, likely reflecting atelectasis. The visualized soft tissues and bony thorax are unremarkable. IMPRESSION: 1. Cardiomegaly with a diffuse interstitial pattern suggesting congestive heart failure. 2. Mild bibasilar airspace disease is worse on the right. While this likely reflects atelectasis, infection is not excluded. Electronically Signed   By: San Morelle M.D.   On: 03/03/2017 11:42    EKG: IndependentlySinus tachycardia, no acute changes   Principal Problem:   Acute respiratory failure with hypoxia and hypercapnia (HCC) Active Problems:   Acute CHF (congestive heart failure) (HCC)   Anasarca   AKI (acute kidney injury) (HCC)   Thrombocytopenia (HCC)   Assessment/Plan 1. Acute hypoxic, hypercapnic respiratory failure, multifactorial, acute CHF, suspected underlying COPD without obvious exacerbation.  Continue BiPAP, follow clinically, she currently is calm, comfortable, alert and  interactive  Aggressive IV diuresis  Bronchodilators as needed 2. Anasarca, massive volume overload, secondary to acute CHF, likely diastolic  Trend troponin, check 2-D echocardiogram 3. Possible acute kidney injury  Suspect relative hypoperfusion of the kidneys. Likely to improve with diuresis 4. Trivial troponin elevation, no history of chest pain. Trend troponin as above. 5. Modest thrombocytopenia, likely insignificant. Follow clinically.  CBC in a.m. 6. PMH right elbow fracture, currently on vancomycin for joint infection  Continue IV vancomycin per pharmacy   It is my clinical opinion that admission to [facility] is reasonable and necessary in this patient  Given the aforementioned, the predictability of an adverse outcome is felt to be significant. I expect that the patient will require at least 2 midnights in the hospital to treat this condition.   DVT prophylaxis:SCDs Code Status: full Family Communication: cousin at bedisde    Time spent: 93 minutes  Murray Hodgkins, MD  [facility] Direct contact: [phone] --[address] [place]  --[email or web address]; password [email or web address]  7PM-7AM contact night coverage as above  03/03/2017, 3:26 PM

## 2017-03-03 NOTE — ED Provider Notes (Signed)
[place] DEPT Provider Note   CSN: [id number] Arrival date & time: 03/03/17  1106  By signing my name below, I, Jeanell Sparrow, attest that this documentation has been prepared under the direction and in the presence of Milton Ferguson, MD. Electronically Signed: Jeanell Sparrow, Scribe. 03/03/2017. 11:32 AM.  History   Chief Complaint Chief Complaint  Patient presents with  . Shortness of Breath   The history is provided by the patient. No language interpreter was used.  Shortness of Breath  This is a chronic problem. The average episode lasts 3 hours. The problem occurs continuously.The current episode started 3 to 5 hours ago. The problem has been gradually improving. Associated symptoms include leg swelling. Pertinent negatives include no headaches, no cough, no chest pain, no abdominal pain and no rash. She has tried inhaled steroids for the symptoms. The treatment provided moderate relief.   HPI Comments: Marie Juarez is a 61 y.o. female with a PMHx of exertional SOB, brought in by ambulance who presents to the Emergency Department complaining of constant SOB that started today. She was seen in the ED on 02/20/17 for leg swelling. She was sent to the ED by the [facility] Nurse for increased SOB and an oxygen saturation of about 60% on room air. She was given Duo Duoneb and started on 4 L/min oxygen therapy with some relief. Her current oxygen is 83% on 4L/min oxygen. She denies any other complaints.     PCP: Jani Gravel, MD  Past Medical History:  Diagnosis Date  . Anxiety   . Arthritis    "knees, hips" (02/06/2017)  . Bulging lumbar disc   . Chronic lower back pain   . Edema   . Fracture dislocation of right elbow joint   . GERD (gastroesophageal reflux disease)   . Hyperlipidemia   . Hypertension   . 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 Shortness of breath    with exertion    Patient Active Problem List   Diagnosis Date Noted  . Postoperative wound infection  02/06/2017  . Postoperative infection 02/06/2017    Past Surgical History:  Procedure Laterality Date  . CESAREAN SECTION  1989  . FRACTURE SURGERY    . HEEL SPUR SURGERY Left 2002  . I&D EXTREMITY Right 02/06/2017   Procedure: IRRIGATION AND DEBRIDEMENT ELBOW and HARDWARE REMOVAL;  Surgeon: Milly Jakob, MD;  Location: [place];  Service: Orthopedics;  Laterality: Right;  . ORIF ELBOW FRACTURE Right 12/19/2016   Procedure: OPEN TREATMENT OF RIGHT ELBOW-DISLOCATION FRACTURE;  Surgeon: Milly Jakob, MD;  Location: [place];  Service: Orthopedics;  Laterality: Right;  GENERAL ANESTHESIA WITH PRE-OP BLOCK  . TARSAL TUNNEL RELEASE Left 2003; 2004    OB History    Gravida Para Term Preterm AB Living   '1 1       1   '$ SAB TAB Ectopic Multiple Live Births                   Home Medications    Prior to Admission medications   Medication Sig Start Date End Date Taking? Authorizing Provider  ALPRAZolam Duanne Moron) 0.5 MG tablet Take 0.5 mg by mouth 3 (three) times daily.    Historical Provider, MD  amLODipine (NORVASC) 5 MG tablet Take 5 mg by mouth daily.    Historical Provider, MD  Ascorbic Acid (VITAMIN C) 1000 MG tablet Take 1,000 mg by mouth daily.    Historical Provider, MD  cholecalciferol (VITAMIN D)  1000 units tablet Take 1,000 Units by mouth daily.    Historical Provider, MD  furosemide (LASIX) 20 MG tablet Take 20 mg by mouth daily.    Historical Provider, MD  gabapentin (NEURONTIN) 800 MG tablet Take 800 mg by mouth 4 (four) times daily.    Historical Provider, MD  HYDROcodone-acetaminophen (NORCO) 10-325 MG tablet Take 1 tablet by mouth 4 (four) times daily as needed for moderate pain.    Historical Provider, MD  nabumetone (RELAFEN) 750 MG tablet Take 750 mg by mouth 2 (two) times daily.    Historical Provider, MD  oxyCODONE (ROXICODONE) 5 MG immediate release tablet Take 1 tablet (5 mg total) by mouth every 6 (six) hours as needed for severe pain or breakthrough pain. Patient not  taking: Reported on 02/20/2017 02/06/17   Milly Jakob, MD  simvastatin (ZOCOR) 40 MG tablet Take 40 mg by mouth at bedtime.     Historical Provider, MD  vancomycin IVPB Inject 750 mg into the vein every 12 (twelve) hours. Indication:  Staph epi right elbow deep infxn Last Day of Therapy:  03/05/17 Labs - Sunday/Monday:  CBC/D, BMP, and vancomycin trough. Labs - Thursday:  BMP and vancomycin trough Labs - Every other week:  ESR and CRP 02/09/17 03/05/17  Leighton Parody, PA-C    Family History Family History  Problem Relation Age of Onset  . Diabetes Mother   . Hypertension Mother   . Stroke Father   . Diabetes Sister   . Hypertension Sister   . Diabetes Brother     borderline  . Diabetes Maternal Grandmother   . Hypertension Maternal Grandmother   . Stroke Maternal Grandfather   . Diabetes Brother   . Diabetes Brother     Social History Social History  Substance Use Topics  . Smoking status: Current Every Day Smoker    Packs/day: 1.50    Years: 37.00    Types: Cigarettes  . Smokeless tobacco: Never Used  . Alcohol use No     Allergies   Keflex [cephalexin]   Review of Systems Review of Systems  Constitutional: Negative for appetite change and fatigue.  HENT: Negative for congestion, ear discharge and sinus pressure.   Eyes: Negative for discharge.  Respiratory: Positive for shortness of breath. Negative for cough.   Cardiovascular: Positive for leg swelling. Negative for chest pain.  Gastrointestinal: Negative for abdominal pain and diarrhea.  Genitourinary: Negative for frequency and hematuria.  Musculoskeletal: Negative for back pain.  Skin: Negative for rash.  Neurological: Negative for seizures and headaches.  Psychiatric/Behavioral: Negative for hallucinations.     Physical Exam Updated Vital Signs BP 125/68   Pulse 93   Temp 98.5 F (36.9 C)   Resp 16   Ht '5\' 2"'$  (1.575 m)   Wt 210 lb (95.3 kg)   SpO2 (!) 83%   BMI 38.41 kg/m   Physical Exam    Constitutional: She is oriented to person, place, and time. She appears well-developed.  HENT:  Head: Normocephalic.  Eyes: Conjunctivae and EOM are normal. No scleral icterus.  Neck: Neck supple. No thyromegaly present.  Cardiovascular: Normal rate and regular rhythm.  Exam reveals no gallop and no friction rub.   No murmur heard. Pulmonary/Chest: No stridor. She has no wheezes. She has no rales. She exhibits no tenderness.  Crackles at the bases.   Abdominal: There is no tenderness. There is no rebound.  Fluid in lower abdomen.  Musculoskeletal: Normal range of motion. She exhibits edema.  Edema +3 all the way up to her thighs.   Lymphadenopathy:    She has no cervical adenopathy.  Neurological: She is oriented to person, place, and time. She exhibits normal muscle tone. Coordination normal.  Skin: No rash noted. No erythema.  Psychiatric: She has a normal mood and affect. Her behavior is normal.     ED Treatments / Results  DIAGNOSTIC STUDIES: Oxygen Saturation is 83% on 4L/min O2, abnormal by my interpretation.    COORDINATION OF CARE: 11:36 AM- Pt advised of plan for treatment and pt agrees.  Labs (all labs ordered are listed, but only abnormal results are displayed) Labs Reviewed  BLOOD GAS, ARTERIAL - Abnormal; Notable for the following:       Result Value   pH, Arterial 7.298 (*)    pCO2 arterial 70.6 (*)    pO2, Arterial 55.6 (*)    Bicarbonate 28.8 (*)    Acid-Base Excess 7.3 (*)    All other components within normal limits  CBC WITH DIFFERENTIAL/PLATELET  BASIC METABOLIC PANEL  BRAIN NATRIURETIC PEPTIDE  TROPONIN I  HEPATIC FUNCTION PANEL    EKG  EKG Interpretation None       Radiology Dg Chest Portable 1 View  Result Date: 03/03/2017 CLINICAL DATA:  Increase shortness of breath and hypoxia. EXAM: PORTABLE CHEST 1 VIEW COMPARISON:  None. FINDINGS: The heart is enlarged. A diffuse interstitial pattern is present. The right hemidiaphragm is elevated.  Bibasilar airspace disease is worse on the right, likely reflecting atelectasis. The visualized soft tissues and bony thorax are unremarkable. IMPRESSION: 1. Cardiomegaly with a diffuse interstitial pattern suggesting congestive heart failure. 2. Mild bibasilar airspace disease is worse on the right. While this likely reflects atelectasis, infection is not excluded. Electronically Signed   By: San Morelle M.D.   On: 03/03/2017 11:42    Procedures Procedures (including critical care time)  Medications Ordered in ED Medications - No data to display   Initial Impression / Assessment and Plan / ED Course  I have reviewed the triage vital signs and the nursing notes.  Pertinent labs & imaging results that were available during my care of the patient were reviewed by me and considered in my medical decision making (see chart for details). CRITICAL CARE Performed by: Roczen Waymire L Total critical care time: 40  minutes Critical care time was exclusive of separately billable procedures and treating other patients. Critical care was necessary to treat or prevent imminent or life-threatening deterioration. Critical care was time spent personally by me on the following activities: development of treatment plan with patient and/or surrogate as well as nursing, discussions with consultants, evaluation of patient's response to treatment, examination of patient, obtaining history from patient or surrogate, ordering and performing treatments and interventions, ordering and review of laboratory studies, ordering and review of radiographic studies, pulse oximetry and re-evaluation of patient's condition.    Patient with congestive heart failure And anasarca. She will be admitted for diuresis   Final Clinical Impressions(s) / ED Diagnoses   Final diagnoses:  None    New Prescriptions New Prescriptions   No medications on file     Milton Ferguson, MD 03/03/17 1424

## 2017-03-03 NOTE — ED Notes (Signed)
CRITICAL VALUE ALERT  Critical value received:  co2 70.6  Date of notification:  03/03/17  Time of notification:  1142  Critical value read back:Yes.    Nurse who received alert:  c Conner Neiss  Responding MD:  Dr zammit  Time MD responded:  [phone]

## 2017-03-03 NOTE — ED Notes (Signed)
Per EDP- Bipap removed and pt placed on O2 at 4L. nad noted.

## 2017-03-03 NOTE — ED Triage Notes (Signed)
[facility] for increased SOB today. EMS reports home health stated pt's O2 saturation was 60's% on room air. Pt on O2 at 4l and received albuterol 5mg  nebulized in route to ED. Pt alert and oriented x 4-labored respirations but speaking in complete sentences. Denies pain but reports feeling sleepy. RT at bedside.

## 2017-03-03 NOTE — ED Notes (Signed)
O2 saturation decreased to 78% on O2 at 4L [place] . O2 increased to 6L [place] and pt instructed to take deep breathes. O2 saturation remains in mid 70's. RT called to replace Bipap. nad noted.

## 2017-03-03 NOTE — Progress Notes (Signed)
Patient arrived on floor from ED after [place], [place]. Patient is alert and oriented, only c/o pain in her right arm which is not new. Foley catheter intact placed by ED. Patient is extremely swollen in legs and abdomen. PICC line dressing changed today. CHG bath complete.

## 2017-03-04 ENCOUNTER — Inpatient Hospital Stay (HOSPITAL_COMMUNITY): Payer: Medicare Other

## 2017-03-04 DIAGNOSIS — I509 Heart failure, unspecified: Secondary | ICD-10-CM

## 2017-03-04 LAB — BASIC METABOLIC PANEL
ANION GAP: 9 (ref 5–15)
BUN: 20 mg/dL (ref 6–20)
CO2: 34 mmol/L — ABNORMAL HIGH (ref 22–32)
Calcium: 8.1 mg/dL — ABNORMAL LOW (ref 8.9–10.3)
Chloride: 94 mmol/L — ABNORMAL LOW (ref 101–111)
Creatinine, Ser: 1.12 mg/dL — ABNORMAL HIGH (ref 0.44–1.00)
GFR calc Af Amer: >60 mL/min (ref >60)
GFR, EST NON AFRICAN AMERICAN: 53 mL/min — AB (ref >60)
Glucose, Bld: 105 mg/dL — ABNORMAL HIGH (ref 65–99)
POTASSIUM: 4.6 mmol/L (ref 3.5–5.1)
SODIUM: 137 mmol/L (ref 135–145)

## 2017-03-04 LAB — CBC
HCT: 42.2 % (ref 36.0–46.0)
HEMOGLOBIN: 12.3 g/dL (ref 12.0–15.0)
MCH: 25.5 pg — ABNORMAL LOW (ref 26.0–34.0)
MCHC: 29.1 g/dL — ABNORMAL LOW (ref 30.0–36.0)
MCV: 87.4 fL (ref 78.0–100.0)
Platelets: 132 10*3/uL — ABNORMAL LOW (ref 150–400)
RBC: 4.83 MIL/uL (ref 3.87–5.11)
RDW: 17.8 % — AB (ref 11.5–15.5)
WBC: 7.8 10*3/uL (ref 4.0–10.5)

## 2017-03-04 LAB — ECHOCARDIOGRAM COMPLETE
Height: 62 in
Weight: 3925.95 oz

## 2017-03-04 LAB — TROPONIN I
TROPONIN I: 0.03 ng/mL — AB (ref <0.03)
Troponin I: <0.03 ng/mL (ref <0.03)

## 2017-03-04 MED ORDER — FUROSEMIDE 10 MG/ML IJ SOLN
60.0000 mg | Freq: Two times a day (BID) | INTRAMUSCULAR | Status: DC
  Administered 2017-03-04 – 2017-03-05 (×2): 60 mg via INTRAVENOUS
  Filled 2017-03-04 (×2): qty 6

## 2017-03-04 NOTE — Care Management Note (Signed)
Case Management Note  Patient Details  Name: Marie Juarez MRN: [id number] Date of Birth: 01/12/1956  Subjective/Objective:  Adm with acute hypoxia, hypercapnic respiratory failure due to CHF and COPD. She is from home with son, has walker and cane PTA. Currently on 4 Liters of oxygen, no oxygen at home. [facility]. [place]. She drives to local appointments, [facility] [place]  She plans to return home at time of discharge.           Action/Plan: Olegario MessierKathy [facility] notified of admission. CM will follow for ongoing needs. ? Need for oxygen.    Expected Discharge Date:       03/06/2017           Expected Discharge Plan:  Home w Home Health Services  In-House Referral:     Discharge planning Services  CM Consult  Post Acute Care Choice:    Choice offered to:     DME Arranged:    DME Agency:     HH Arranged:    HH Agency:  [facility]  Status of Service:  In process, will continue to follow  If discussed at Long Length of Stay Meetings, dates discussed:    Additional Comments:  Rexine Gowens, Chrystine OilerSharley Diane, RN 03/04/2017, 11:50 AM

## 2017-03-04 NOTE — Plan of Care (Signed)
Problem: Fluid Volume: Goal: Ability to maintain a balanced intake and output will improve Outcome: Progressing Receiving IV Lasix and has foley inserted

## 2017-03-04 NOTE — Progress Notes (Signed)
PROGRESS NOTE  Marie Juarez ZOX:[id number] DOB: 08/24/1956 DOA: 03/03/2017 PCP: Pearson GrippeJames Kim, MD  Brief Narrative: 61 year old woman PMH hypertension, hyperlipidemia, lower extremity edema present to the emergency department after being found hypoxic at home by her home health nurse. Initial evaluation revealed acute hypoxic and hypercapnic respiratory failure requiring BiPAP, most likely secondary to CHF based on exam and chest x-ray.  Assessment/Plan 1. Acute hypoxic, hypercapnic respiratory failure; multifactorial: Acute CHF, underlying COPD without exacerbation.  Currently off BiPAP but still tachypneic. Wean oxygen as tolerated. 2. Anasarca, massive volume overload, presumably secondary to acute diastolic CHF. No echocardiogram on file.  Follow-up echocardiogram.  Increase Lasix dosing. 3. Acute kidney injury. Resolving with IV diuresis. Secondary to relative hypoperfusion of the kidney secondary to CHF.  Continue diuresis. Check BMP in a.m. 4. Trivial troponin elevation. No chest pain. EKG nonacute. No further evaluation suggested. 5. Modest thrombocytopenia.  Etiology and significance unclear. Continue to monitor. CBC in a.m. 6. PMH right elbow fracture, currently on vancomycin for joint infection.  Continue vancomycin. Keep follow-up with infectious disease and orthopedics as an outpatient.  DVT prophylaxis: SCDs Code Status: full Family Communication: none Disposition Plan: home    Brendia Sacksaniel San Rua, MD  [facility] Direct contact: [phone] --Via amion app OR  --[email or web address]; password [email or web address]  7PM-7AM contact night coverage as above 03/04/2017, 9:45 AM  LOS: 1 day   Consultants:    Procedures:    Antimicrobials:    Interval history/Subjective: Used BiPAP overnight. Feels better today. Breathing better.  Objective: Vitals:   03/04/17 0700 03/04/17 0736 03/04/17 0800 03/04/17 0900  BP: 128/75  128/75 (!) 117/48  Pulse: 79 75 89 94  Resp: 12 11  18  (!) 23  Temp:  98.5 F (36.9 C)    TempSrc:  Axillary    SpO2: 95% 95% 97% 90%  Weight:      Height:        Intake/Output Summary (Last 24 hours) at 03/04/17 0945 Last data filed at 03/04/17 0848  Gross per 24 hour  Intake            254.5 ml  Output              750 ml  Net           -495.5 ml     Filed Weights   03/03/17 1112 03/03/17 1653 03/04/17 0500  Weight: 95.3 kg (210 lb) 111.9 kg (246 lb 11.1 oz) 111.3 kg (245 lb 6 oz)    Exam:    Constitutional: Appears better but still significantly short of breath. Calm, comfortable. Cardiovascular: Regular rate and rhythm. No murmur, rub or gallop. 4+ bilateral lower extremity edema without significant change from feet up to abdomen. Telemetry sinus rhythm. Respiratory: Few expiratory wheezes. No rhonchi or rales. Moderate increased respiratory effort. Speaks in short sentences. Psychiatric: Grossly normal mood and affect. Speech fluent and appropriate.   I have personally reviewed following labs and imaging studies:  Urine output 750, I/O -495 since admission  Creatinine improved, 1.12, potassium normal  Troponins essentially negative.  Platelets slightly lower, 132. Remainder CBC unremarkable.  Scheduled Meds: . ALPRAZolam  0.5 mg Oral TID  . aspirin EC  81 mg Oral Daily  . Chlorhexidine Gluconate Cloth  6 each Topical Q0600  . furosemide  40 mg Intravenous Q12H  . gabapentin  800 mg Oral QID  . simvastatin  40 mg Oral QHS  . sodium chloride flush  3 mL Intravenous Q12H  . sodium chloride  flush  3 mL Intravenous Q12H  . Vancomycin  750 mg Intravenous Q12H   Continuous Infusions:  Principal Problem:   Acute respiratory failure with hypoxia and hypercapnia (HCC) Active Problems:   Acute CHF (congestive heart failure) (HCC)   Anasarca   AKI (acute kidney injury) (HCC)   Thrombocytopenia (HCC)   LOS: 1 day

## 2017-03-04 NOTE — Progress Notes (Signed)
*  PRELIMINARY RESULTS* Echocardiogram 2D Echocardiogram has been performed with Definity.  Stacey DrainWhite, Dalyce Renne J 03/04/2017, 10:20 AM

## 2017-03-05 DIAGNOSIS — I5020 Unspecified systolic (congestive) heart failure: Secondary | ICD-10-CM

## 2017-03-05 LAB — C DIFFICILE QUICK SCREEN W PCR REFLEX
C DIFFICILE (CDIFF) INTERP: NOT DETECTED
C DIFFICILE (CDIFF) TOXIN: NEGATIVE
C DIFFICLE (CDIFF) ANTIGEN: NEGATIVE

## 2017-03-05 LAB — BASIC METABOLIC PANEL
Anion gap: 9 (ref 5–15)
BUN: 18 mg/dL (ref 6–20)
CALCIUM: 8 mg/dL — AB (ref 8.9–10.3)
CO2: 36 mmol/L — AB (ref 22–32)
CREATININE: 0.91 mg/dL (ref 0.44–1.00)
Chloride: 93 mmol/L — ABNORMAL LOW (ref 101–111)
GFR calc non Af Amer: >60 mL/min (ref >60)
Glucose, Bld: 83 mg/dL (ref 65–99)
Potassium: 3.7 mmol/L (ref 3.5–5.1)
SODIUM: 138 mmol/L (ref 135–145)

## 2017-03-05 MED ORDER — FUROSEMIDE 10 MG/ML IJ SOLN
80.0000 mg | Freq: Two times a day (BID) | INTRAMUSCULAR | Status: DC
  Administered 2017-03-05 – 2017-03-11 (×11): 80 mg via INTRAVENOUS
  Filled 2017-03-05 (×12): qty 8

## 2017-03-05 MED ORDER — VANCOMYCIN HCL IN DEXTROSE 750-5 MG/150ML-% IV SOLN
INTRAVENOUS | Status: AC
Start: 2017-03-05 — End: 2017-03-05
  Filled 2017-03-05: qty 150

## 2017-03-05 NOTE — Evaluation (Signed)
Physical Therapy Evaluation Patient Details Name: Marie Juarez MRN: [id number] DOB: February 22, 1956 Today's Date: 03/05/2017   History of Present Illness  61 year old woman PMH hypertension, hyperlipidemia, lower extremity edema present to the emergency department after being found hypoxic at home by her home health nurse. Initial evaluation revealed acute hypoxic and hypercapnic respiratory failure requiring BiPAP, most likely secondary to CHF based on exam and chest x-ray.  Dx: Acute hypoxic, hypercapnic respiratory failure, multifactorial, acute CHF, suspected underlying COPD without obvious exacerbation, and anasarca with massive volume overload due to acute CHF.  Please note the following: 11/23/2016 Fall on outstretched arm and sustained a R Posterior elbow dislocation with displaced intra-articular fracture of the RIGHT radial head and probable displaced coronoid process ulna fracture.  Pt underwent closed reduction and returned home.  She followed up in the office and was recommended for surgical repair at that time.  12/19/2016 Pt underwent ORIF of R elbow.  02/06/2017 Pt sustained another fall, which opened up her surgical incision and she had with expressible flocculant drainage.  She required an I & D with hardware removal, and was to remain NWB on R UE.  She is scheduled for follow up with orthopedics on 03/18/2017.  Clinical Impression  Pt received in bed, and was agreeable to PT evaluation.  She stated that she has been using a RW at home for mobilization due to improved balance with the RW, however she also has a cane.  PT educated pt on restriction and NWB status on R UE.  Pt states that she is independent with dressing and sponge bathing.  She has family that helps her with running errands.  During PT evaluation, she required multiple cues to not use R UE to push or pull with - especially when transferring sit<>stand.  She was able to ambulate 46ft with use of IV pole in the L UE to stabilize  herself.  Further distance is limited due to fatigue.  Recommend that pt f/u with HHPT due to pt's high risk for recurrent falls.    Follow Up Recommendations Home health PT;Supervision/Assistance - 24 hour    Equipment Recommendations  None recommended by PT (Encourage pt to use the cane instead of the RW due to NWB on R UE.  )    Recommendations for Other Services       Precautions / Restrictions Precautions Precautions: Fall Precaution Comments: 2 Restrictions Weight Bearing Restrictions: Yes RUE Weight Bearing: Non weight bearing Other Position/Activity Restrictions: NWB R UE per Dannielle Burn, PA progress note on 02/09/2017.  Pt expressed that she was unaware of this precaution.       Mobility  Bed Mobility Overal bed mobility: Modified Independent             General bed mobility comments: HOB slightly raised.   Transfers Overall transfer level: Needs assistance Equipment used: None Transfers: Sit to/from [employer] Sit to Stand: Min assist Stand pivot transfers: Min assist          Ambulation/Gait Ambulation/Gait assistance: Min guard Ambulation Distance (Feet): 20 Feet Assistive device:  (L UE pushed IV pole) Gait Pattern/deviations: Wide base of support;Step-through pattern;Trunk flexed   Gait velocity interpretation: <1.8 ft/sec, indicative of risk for recurrent falls    Stairs            Wheelchair Mobility    Modified Rankin (Stroke Patients Only)       Balance Overall balance assessment: History of Falls;Needs assistance Sitting-balance support: Single extremity supported;Feet supported  Sitting balance-Leahy Scale: Good     Standing balance support: Single extremity supported Standing balance-Leahy Scale: Fair                               Pertinent Vitals/Pain Pain Assessment: No/denies pain (no new pain)    Home Living   Living Arrangements: Children (son -at the house with pt most of the  time. ) Available Help at Discharge: Available PRN/intermittently;Home health Type of Home: House Home Access: Stairs to enter   [employer] of Steps: pt goes in through the car port, which has 1 step.  Home Layout: Able to live on main level with bedroom/bathroom;Laundry or work area in basement (Pt limits how much she goes to the basement, and states she can have her son do the washing.  ) Home Equipment: Gilmer Mor - single point;Walker - 2 wheels Additional Comments: [employer] has been assisting with taking her to grocery store, and brother assists with groceries.      Prior Function     Gait / Transfers Assistance Needed: Pt states she has been using the RW since her d/c from [facility] in February.  Pt states she was not informed of NWB status of R UE.    ADL's / Homemaking Assistance Needed: independent with dressing, independent with sponge bath. Prior to R elbow fx, she was able to get into the shower for bathing.          Hand Dominance   Dominant Hand: Right    Extremity/Trunk Assessment   Upper Extremity Assessment Upper Extremity Assessment: RUE deficits/detail;LUE deficits/detail;Overall [facility] for tasks assessed RUE Deficits / Details: Ace wrap currently on R UE from the wrist to the shoulder.  Noted significant hand edema RUE: Unable to fully assess due to immobilization LUE Deficits / Details: [facility]    Lower Extremity Assessment Lower Extremity Assessment: Generalized weakness       Communication   Communication: No difficulties  Cognition Arousal/Alertness: Awake/alert Behavior During Therapy: WFL for tasks assessed/performed Overall Cognitive Status: Within Functional Limits for tasks assessed                      General Comments      Exercises     Assessment/Plan    PT Assessment Patient needs continued PT services  PT Problem List Decreased strength;Decreased activity tolerance;Decreased balance;Decreased mobility;Decreased knowledge of use of  DME;Decreased safety awareness;Decreased knowledge of precautions;Decreased skin integrity;Obesity       PT Treatment Interventions DME instruction;Gait training;Functional mobility training;Therapeutic activities;Therapeutic exercise;Balance training;Patient/family education    PT Goals (Current goals can be found in the Care Plan section)  Acute Rehab PT Goals Patient Stated Goal: To go home PT Goal Formulation: With patient Time For Goal Achievement: 03/12/17 Potential to Achieve Goals: Fair    Frequency Min 3X/week   Barriers to discharge        Co-evaluation               End of Session Equipment Utilized During Treatment: Gait belt;Oxygen Activity Tolerance: Patient limited by fatigue Patient left: in chair;with call bell/phone within reach Nurse Communication: Mobility status Brayton Caves, RN notified of pt's mobility status, and mobility sheet left in the room. ) PT Visit Diagnosis: Muscle weakness (generalized) (M62.81);History of falling (Z91.81);Other abnormalities of gait and mobility (R26.89)    Functional Assessment Tool Used: AM-PAC 6 Clicks Basic Mobility;Clinical judgement Functional Limitation: Mobility: Walking and moving around Mobility:  Walking and Moving Around Current Status [phone](G8978): At least 20 percent but less than 40 percent impaired, limited or restricted Mobility: Walking and Moving Around Goal Status [phone](G8979): At least 1 percent but less than 20 percent impaired, limited or restricted    Time: [id number] PT Time Calculation (min) (ACUTE ONLY): 46 min   Charges:   PT Evaluation $PT Eval Low Complexity: 1 Procedure PT Treatments $Gait Training: 8-22 mins $Therapeutic Exercise: 8-22 mins   PT G Codes:   PT G-Codes **NOT FOR INPATIENT CLASS** Functional Assessment Tool Used: AM-PAC 6 Clicks Basic Mobility;Clinical judgement Functional Limitation: Mobility: Walking and moving around Mobility: Walking and Moving Around Current Status ([id number](G8978): At  least 20 percent but less than 40 percent impaired, limited or restricted Mobility: Walking and Moving Around Goal Status [phone](G8979): At least 1 percent but less than 20 percent impaired, limited or restricted     Beth Osaze Hubbert, PT, DPT X: [phone]

## 2017-03-05 NOTE — Plan of Care (Signed)
Problem: Safety: Goal: Ability to remain free from injury will improve Outcome: Progressing Patient will use call bell when she needs to get up or move in bed.

## 2017-03-05 NOTE — Progress Notes (Signed)
PROGRESS NOTE    Marie FritzBarbara T Juarez  ZOX:[id number] DOB: 09/27/1956 DOA: 03/03/2017 PCP: Pearson GrippeJames Kim, MD   Brief Narrative:  61 year old woman PMH hypertension, hyperlipidemia, lower extremity edema present to the emergency department after being found hypoxic at home by her home health nurse. Initial evaluation revealed acute hypoxic and hypercapnic respiratory failure requiring BiPAP, most likely secondary to CHF based on exam and chest x-ray.  Was able to wean off BiPap and only use it at night.    Assessment & Plan:   Principal Problem:   Acute respiratory failure with hypoxia and hypercapnia (HCC) Active Problems:   Acute CHF (congestive heart failure) (HCC)   Anasarca   AKI (acute kidney injury) (HCC)   Thrombocytopenia (HCC)  Acute hypoxic, hypercapnic respiratory failure - Acute CHF  - daily weights, I/O, sodium restriction - underlying COPD without exacerbation. - currently off BiPAP but still tachypneic - used BiPap overnight - Still working harder to breath than normal  Anasarca - massive volume overload (presumably secondary to acute diastolic CHF) - echocardiogram showing Mild LVH with LVEF 65-70%. Grossly normal diastolic function.   Mild calcified mitral annulus with trivial mitral regurgitation.   Mildly calcified aortic annulus. Trivial tricuspid regurgitation. - lasix 80mg  IV BID  Acute kidney injury - Improved with IV diuresis (Secondary to relative hypoperfusion of the kidney secondary to CHF) - Continue IV  Diuresis - daily BMP  Trivial troponin elevation - No chest pain - EKG nonacute - No further evaluation suggested.  Modest thrombocytopenia. - Etiology and significance unclear - repeat CBCD in am - Platelets of 132 this am   PMH right elbow fracture, currently on vancomycin for joint infection. - Continue vancomycin - follow-up with infectious disease and orthopedics as an outpatient.  DVT prophylaxis: SCDs Code Status: full Family  Communication: none Disposition Plan: unclear- pending PT eval may need short term rehab   Consultants:   None  Procedures:   Echocardiogram on 3/20  Antimicrobials:   Vancomycin    Subjective: Patient seen this am. She is still working somewhat hard to breath.  Says she doesn't understand where all her swelling is coming from.  Reports that she lives with her son and believes he will be able to care for her when she returns home.  Objective: Vitals:   03/05/17 0236 03/05/17 0300 03/05/17 0400 03/05/17 0500  BP:  129/72 128/80   Pulse: 78 74 74   Resp: 13 14 17    Temp:   98.9 F (37.2 C)   TempSrc:   Axillary   SpO2: 92% (!) 88% (!) 87%   Weight:    108.3 kg (238 lb 12.1 oz)  Height:        Intake/Output Summary (Last 24 hours) at 03/05/17 0733 Last data filed at 03/05/17 [id number]  Gross per 24 hour  Intake              954 ml  Output             3401 ml  Net            -2447 ml   Filed Weights   03/03/17 1653 03/04/17 0500 03/05/17 0500  Weight: 111.9 kg (246 lb 11.1 oz) 111.3 kg (245 lb 6 oz) 108.3 kg (238 lb 12.1 oz)    Examination:  General exam: mildly distressed Respiratory system: increased respiratory effort, working slightly harder to breath, rales in lung  lower lung fields bilaterally Cardiovascular system: S1 & S2 heard, RRR. No JVD,  murmurs, rubs, gallops or clicks. 4+  Bilateral lower extremity edema to groin Gastrointestinal system: Abdomen is distended, soft and nontender. Edema of lower abdomen, No organomegaly or masses felt. Normal bowel sounds heard. Central nervous system: Alert and oriented. No focal neurological deficits. Extremities: able to move lower extremities independently, able to move L UE independently, R UE non weightbearing and minimal movement . Skin: No rashes, lesions or ulcers Psychiatry:  Mood & affect appropriate.     Data Reviewed: I have personally reviewed following labs and imaging studies  CBC:  Recent Labs Lab  03/03/17 1133 03/04/17 0619  WBC 7.1 7.8  NEUTROABS 5.0  --   HGB 12.8 12.3  HCT 44.1 42.2  MCV 87.8 87.4  PLT 144* 132*   Basic Metabolic Panel:  Recent Labs Lab 02/28/17 1330 03/03/17 1133 03/04/17 0619 03/05/17 0426  NA 138 135 137 138  K 4.9 5.0 4.6 3.7  CL 96* 93* 94* 93*  CO2 33* 33* 34* 36*  GLUCOSE 95 136* 105* 83  BUN 11 20 20 18   CREATININE 0.97 1.25* 1.12* 0.91  CALCIUM 8.8* 8.7* 8.1* 8.0*   GFR: Estimated Creatinine Clearance: 77.1 mL/min (by C-G formula based on SCr of 0.91 mg/dL). Liver Function Tests:  Recent Labs Lab 03/03/17 1133  AST 28  ALT 12*  ALKPHOS 93  BILITOT 1.1  PROT 6.9  ALBUMIN 3.3*   No results for input(s): LIPASE, AMYLASE in the last 168 hours. No results for input(s): AMMONIA in the last 168 hours. Coagulation Profile: No results for input(s): INR, PROTIME in the last 168 hours. Cardiac Enzymes:  Recent Labs Lab 03/03/17 1133 03/03/17 1758 03/04/17 0058 03/04/17 0619  TROPONINI 0.03* 0.03* 0.03* <0.03   BNP (last 3 results) No results for input(s): PROBNP in the last 8760 hours. HbA1C: No results for input(s): HGBA1C in the last 72 hours. CBG: No results for input(s): GLUCAP in the last 168 hours. Lipid Profile: No results for input(s): CHOL, HDL, LDLCALC, TRIG, CHOLHDL, LDLDIRECT in the last 72 hours. Thyroid Function Tests: No results for input(s): TSH, T4TOTAL, FREET4, T3FREE, THYROIDAB in the last 72 hours. Anemia Panel: No results for input(s): VITAMINB12, FOLATE, FERRITIN, TIBC, IRON, RETICCTPCT in the last 72 hours. Sepsis Labs: No results for input(s): PROCALCITON, LATICACIDVEN in the last 168 hours.  No results found for this or any previous visit (from the past 240 hour(s)).       Radiology Studies: Dg Chest Portable 1 View  Result Date: 03/03/2017 CLINICAL DATA:  Increase shortness of breath and hypoxia. EXAM: PORTABLE CHEST 1 VIEW COMPARISON:  None. FINDINGS: The heart is enlarged. A diffuse  interstitial pattern is present. The right hemidiaphragm is elevated. Bibasilar airspace disease is worse on the right, likely reflecting atelectasis. The visualized soft tissues and bony thorax are unremarkable. IMPRESSION: 1. Cardiomegaly with a diffuse interstitial pattern suggesting congestive heart failure. 2. Mild bibasilar airspace disease is worse on the right. While this likely reflects atelectasis, infection is not excluded. Electronically Signed   By: Marin Roberts M.D.   On: 03/03/2017 11:42        Scheduled Meds: . ALPRAZolam  0.5 mg Oral TID  . aspirin EC  81 mg Oral Daily  . Chlorhexidine Gluconate Cloth  6 each Topical Q0600  . furosemide  60 mg Intravenous Q12H  . gabapentin  800 mg Oral QID  . simvastatin  40 mg Oral QHS  . sodium chloride flush  3 mL Intravenous Q12H  . sodium chloride  flush  3 mL Intravenous Q12H  . Vancomycin  750 mg Intravenous Q12H   Continuous Infusions:   LOS: 2 days    Time spent: 35 minutes    Katrinka Blazing, MD [facility] Pager [phone]  If 7PM-7AM, please contact night-coverage [email or web address] Password [facility] 03/05/2017, 7:33 AM

## 2017-03-06 LAB — BASIC METABOLIC PANEL
Anion gap: 8 (ref 5–15)
BUN: 15 mg/dL (ref 6–20)
CALCIUM: 7.9 mg/dL — AB (ref 8.9–10.3)
CO2: 43 mmol/L — ABNORMAL HIGH (ref 22–32)
Chloride: 91 mmol/L — ABNORMAL LOW (ref 101–111)
Creatinine, Ser: 0.84 mg/dL (ref 0.44–1.00)
GFR calc Af Amer: >60 mL/min (ref >60)
Glucose, Bld: 78 mg/dL (ref 65–99)
Potassium: 3 mmol/L — ABNORMAL LOW (ref 3.5–5.1)
Sodium: 142 mmol/L (ref 135–145)

## 2017-03-06 LAB — CBC WITH DIFFERENTIAL/PLATELET
BASOS ABS: 0 10*3/uL (ref 0.0–0.1)
Basophils Relative: 0 %
EOS ABS: 0.4 10*3/uL (ref 0.0–0.7)
Eosinophils Relative: 7 %
HEMATOCRIT: 42.4 % (ref 36.0–46.0)
Hemoglobin: 12.2 g/dL (ref 12.0–15.0)
LYMPHS ABS: 1 10*3/uL (ref 0.7–4.0)
Lymphocytes Relative: 18 %
MCH: 25.4 pg — AB (ref 26.0–34.0)
MCHC: 28.8 g/dL — AB (ref 30.0–36.0)
MCV: 88.3 fL (ref 78.0–100.0)
MONOS PCT: 10 %
Monocytes Absolute: 0.6 10*3/uL (ref 0.1–1.0)
Neutro Abs: 3.5 10*3/uL (ref 1.7–7.7)
Neutrophils Relative %: 65 %
Platelets: 135 10*3/uL — ABNORMAL LOW (ref 150–400)
RBC: 4.8 MIL/uL (ref 3.87–5.11)
RDW: 17.8 % — AB (ref 11.5–15.5)
WBC: 5.5 10*3/uL (ref 4.0–10.5)

## 2017-03-06 NOTE — Progress Notes (Signed)
Physical Therapy Treatment Patient Details Name: Mliss FritzBarbara T Agner MRN: [id number] DOB: 08/12/1956 Today's Date: 03/06/2017    History of Present Illness 61 year old woman PMH hypertension, hyperlipidemia, lower extremity edema present to the emergency department after being found hypoxic at home by her home health nurse. Initial evaluation revealed acute hypoxic and hypercapnic respiratory failure requiring BiPAP, most likely secondary to CHF based on exam and chest x-ray.  Dx: Acute hypoxic, hypercapnic respiratory failure, multifactorial, acute CHF, suspected underlying COPD without obvious exacerbation, and anasarca with massive volume overload due to acute CHF.  Please note the following: 11/23/2016 Fall on outstretched arm and sustained a R Posterior elbow dislocation with displaced intra-articular fracture of the RIGHT radial head and probable displaced coronoid process ulna fracture.  Pt underwent closed reduction and returned home.  She followed up in the office and was recommended for surgical repair at that time.  12/19/2016 Pt underwent ORIF of R elbow.  02/06/2017 Pt sustained another fall, which opened up her surgical incision and she had with expressible flocculant drainage.  She required an I & D with hardware removal, and was to remain NWB on R UE.  She is scheduled for follow up with orthopedics on 03/18/2017.    PT Comments    Pt received sitting up in the chair, and was agreeable to PT tx.  Pt demonstrated improved gait distance today, and was able to ambulate 23100ft with cane, however she continues to require Min A due to unsteadiness and LOB.  Unable to use RW at this time due to NWB R UE with previous elbow fx.  Continue to recommend HHPT with 24/7 supervision/assistance at this time.  Pt may need a quad cane upon d/c home.  She is going to have her son check to see if they have one at home already.     Follow Up Recommendations  Home health PT;Supervision/Assistance - 24 hour      Equipment Recommendations  Other (comment) (Possibly a quad cane.  Pt stated that when she speaks with her son, she will ask him to check if she has a quad cane. )    Recommendations for Other Services       Precautions / Restrictions Precautions Precautions: Fall Restrictions Weight Bearing Restrictions: Yes RUE Weight Bearing: Non weight bearing Other Position/Activity Restrictions: NWB R UE per Dannielle BurnEric Phillips, PA progress note on 02/09/2017.  Pt expressed that she was unaware of this precaution.     Mobility  Bed Mobility                  Transfers Overall transfer level: Needs assistance Equipment used: Straight cane Transfers: Sit to/from Stand Sit to Stand: Min guard            Ambulation/Gait Ambulation/Gait assistance: Min assist Ambulation Distance (Feet): 200 Feet Assistive device: Straight cane Gait Pattern/deviations: Step-through pattern;Wide base of support   Gait velocity interpretation: <1.8 ft/sec, indicative of risk for recurrent falls General Gait Details: Pt is very curious about what is going on in the hallway and other patient rooms.   Demonstrates LOB while negotiating around objects and when turning head to look at things.  Cues for pt to stay on task, and go slow.     Stairs            Wheelchair Mobility    Modified Rankin (Stroke Patients Only)       Balance Overall balance assessment: History of Falls;Needs assistance Sitting-balance support: Single extremity supported;Feet supported Sitting balance-Leahy  Scale: Good     Standing balance support: Single extremity supported Standing balance-Leahy Scale: Fair                      Cognition Arousal/Alertness: Awake/alert Behavior During Therapy: WFL for tasks assessed/performed Overall Cognitive Status: Within Functional Limits for tasks assessed                      Exercises      General Comments        Pertinent Vitals/Pain Pain  Assessment: No/denies pain    Home Living                      Prior Function            PT Goals (current goals can now be found in the care plan section) Acute Rehab PT Goals Patient Stated Goal: To go home PT Goal Formulation: With patient Time For Goal Achievement: 03/12/17 Potential to Achieve Goals: Fair Progress towards PT goals: Progressing toward goals    Frequency    Min 3X/week      PT Plan Current plan remains appropriate    Co-evaluation             End of Session Equipment Utilized During Treatment: Gait belt;Oxygen Activity Tolerance: Patient tolerated treatment well Patient left: with nursing/sitter in room (in w/c preparing to transfer to floor 300) Nurse Communication: Mobility status PT Visit Diagnosis: Muscle weakness (generalized) (M62.81);History of falling (Z91.81);Other abnormalities of gait and mobility (R26.89)     Time: 1610-9604 PT Time Calculation (min) (ACUTE ONLY): 23 min  Charges:  $Gait Training: 23-37 mins                    G Codes:       Beth Wes Lezotte, PT, DPT X: [phone]

## 2017-03-06 NOTE — Progress Notes (Signed)
Student called report to 300 RN. Patient placed in wheel chair by physical therapy and we rolled patient up stairs to room 322 and Nurse on floor acknowledged patient there.

## 2017-03-06 NOTE — Progress Notes (Signed)
PROGRESS NOTE    Marie FritzBarbara T Juarez  [id number]:[id number] DOB: 07/10/1956 DOA: 03/03/2017 PCP: Pearson GrippeJames Kim, MD   Brief Narrative:  61 year old woman PMH hypertension, hyperlipidemia, lower extremity edema present to the emergency department after being found hypoxic at home by her home health nurse. Initial evaluation revealed acute hypoxic and hypercapnic respiratory failure requiring BiPAP, most likely secondary to CHF based on exam and chest x-ray.  Was able to wean off BiPap and only use it at night.    Assessment & Plan:   Principal Problem:   Acute respiratory failure with hypoxia and hypercapnia (HCC) Active Problems:   Acute CHF (congestive heart failure) (HCC)   Anasarca   AKI (acute kidney injury) (HCC)   Thrombocytopenia (HCC)  Acute hypoxic, hypercapnic respiratory failure - Acute CHF  - daily weights, I/O, sodium restriction - output of 5950ml yesterday (net -9.5L since admission) - underlying COPD without exacerbation. - currently off BiPAP but still tachypneic - used BiPap overnight - Still working harder to breath than normal  Anasarca - massive volume overload (presumably secondary to acute diastolic CHF) - echocardiogram showing Mild LVH with LVEF 65-70%. Grossly normal diastolic function.   Mild calcified mitral annulus with trivial mitral regurgitation.   Mildly calcified aortic annulus. Trivial tricuspid regurgitation. - lasix 80mg  IV BID  Acute kidney injury - Improved with IV diuresis (Secondary to relative hypoperfusion of the kidney secondary to CHF) - Continue IV  Diuresis - daily BMP  Trivial troponin elevation - No chest pain - EKG nonacute - No further evaluation suggested.  Modest thrombocytopenia. - Etiology and significance unclear - repeat CBCD pending   PMH right elbow fracture, currently on vancomycin for joint infection. - Continue vancomycin - follow-up with infectious disease and orthopedics as an outpatient.  DVT prophylaxis:  SCDs Code Status: full Family Communication: none Disposition Plan: needs HH, continue to work with PT, pending assessment prior to discharge   Consultants:   None  Procedures:   Echocardiogram on 3/20  Antimicrobials:   Vancomycin    Subjective: Patient seen this am. She appears to be breathing easier.  Denies any next symptoms.  Still having significant swelling of the lower extremities to lower abdomen.  Says she slept well. No overnight events.  Objective: Vitals:   03/06/17 0400 03/06/17 0500 03/06/17 0721 03/06/17 0803  BP:      Pulse:   78   Resp:   18   Temp: 97.5 F (36.4 C)  98.7 F (37.1 C)   TempSrc: Axillary  Oral   SpO2:   93% 95%  Weight:  103.9 kg (229 lb 0.9 oz)    Height:        Intake/Output Summary (Last 24 hours) at 03/06/17 1011 Last data filed at 03/06/17 0957  Gross per 24 hour  Intake              476 ml  Output             5550 ml  Net            -5074 ml   Filed Weights   03/04/17 0500 03/05/17 0500 03/06/17 0500  Weight: 111.3 kg (245 lb 6 oz) 108.3 kg (238 lb 12.1 oz) 103.9 kg (229 lb 0.9 oz)    Examination:  General exam: appears more calm today Respiratory system: rales in lung  lower lung fields bilaterally Cardiovascular system: S1 & S2 heard, RRR. No JVD, murmurs, rubs, gallops or clicks. 4+  Bilateral lower extremity edema  to lower abdomen Gastrointestinal system: Abdomen is distended, soft and nontender. Edema of lower abdomen, No organomegaly or masses felt. Normal bowel sounds heard. Central nervous system: Alert and oriented. No focal neurological deficits. Extremities: able to move lower extremities independently, able to move L UE independently, R UE non weightbearing and minimal movement, ace wrap in place Skin: No rashes, lesions or ulcers Psychiatry:  Mood & affect appropriate, questionable insight    Data Reviewed: I have personally reviewed following labs and imaging studies  CBC:  Recent Labs Lab  03/03/17 1133 03/04/17 0619  WBC 7.1 7.8  NEUTROABS 5.0  --   HGB 12.8 12.3  HCT 44.1 42.2  MCV 87.8 87.4  PLT 144* 132*   Basic Metabolic Panel:  Recent Labs Lab 02/28/17 1330 03/03/17 1133 03/04/17 0619 03/05/17 0426 03/06/17 0441  NA 138 135 137 138 142  K 4.9 5.0 4.6 3.7 3.0*  CL 96* 93* 94* 93* 91*  CO2 33* 33* 34* 36* 43*  GLUCOSE 95 136* 105* 83 78  BUN 11 20 20 18 15   CREATININE 0.97 1.25* 1.12* 0.91 0.84  CALCIUM 8.8* 8.7* 8.1* 8.0* 7.9*   GFR: Estimated Creatinine Clearance: 81.5 mL/min (by C-G formula based on SCr of 0.84 mg/dL). Liver Function Tests:  Recent Labs Lab 03/03/17 1133  AST 28  ALT 12*  ALKPHOS 93  BILITOT 1.1  PROT 6.9  ALBUMIN 3.3*   No results for input(s): LIPASE, AMYLASE in the last 168 hours. No results for input(s): AMMONIA in the last 168 hours. Coagulation Profile: No results for input(s): INR, PROTIME in the last 168 hours. Cardiac Enzymes:  Recent Labs Lab 03/03/17 1133 03/03/17 1758 03/04/17 0058 03/04/17 0619  TROPONINI 0.03* 0.03* 0.03* <0.03   BNP (last 3 results) No results for input(s): PROBNP in the last 8760 hours. HbA1C: No results for input(s): HGBA1C in the last 72 hours. CBG: No results for input(s): GLUCAP in the last 168 hours. Lipid Profile: No results for input(s): CHOL, HDL, LDLCALC, TRIG, CHOLHDL, LDLDIRECT in the last 72 hours. Thyroid Function Tests: No results for input(s): TSH, T4TOTAL, FREET4, T3FREE, THYROIDAB in the last 72 hours. Anemia Panel: No results for input(s): VITAMINB12, FOLATE, FERRITIN, TIBC, IRON, RETICCTPCT in the last 72 hours. Sepsis Labs: No results for input(s): PROCALCITON, LATICACIDVEN in the last 168 hours.  Recent Results (from the past 240 hour(s))  C difficile quick scan w PCR reflex     Status: None   Collection Time: 03/05/17  9:30 AM  Result Value Ref Range Status   C Diff antigen NEGATIVE NEGATIVE Final   C Diff toxin NEGATIVE NEGATIVE Final   C Diff  interpretation No C. difficile detected.  Final         Radiology Studies: No results found.      Scheduled Meds: . ALPRAZolam  0.5 mg Oral TID  . aspirin EC  81 mg Oral Daily  . Chlorhexidine Gluconate Cloth  6 each Topical Q0600  . furosemide  80 mg Intravenous Q12H  . gabapentin  800 mg Oral QID  . simvastatin  40 mg Oral QHS  . sodium chloride flush  3 mL Intravenous Q12H  . sodium chloride flush  3 mL Intravenous Q12H   Continuous Infusions:   LOS: 3 days    Time spent: 35 minutes    Katrinka Blazing, MD [facility] Pager [phone]  If 7PM-7AM, please contact night-coverage [email or web address] Password [facility] 03/06/2017, 10:11 AM

## 2017-03-07 LAB — BASIC METABOLIC PANEL
Anion gap: 10 (ref 5–15)
BUN: 16 mg/dL (ref 6–20)
CALCIUM: 7.8 mg/dL — AB (ref 8.9–10.3)
CO2: 44 mmol/L — ABNORMAL HIGH (ref 22–32)
CREATININE: 0.86 mg/dL (ref 0.44–1.00)
Chloride: 87 mmol/L — ABNORMAL LOW (ref 101–111)
GFR calc Af Amer: >60 mL/min (ref >60)
GLUCOSE: 85 mg/dL (ref 65–99)
POTASSIUM: 3 mmol/L — AB (ref 3.5–5.1)
SODIUM: 141 mmol/L (ref 135–145)

## 2017-03-07 MED ORDER — POTASSIUM CHLORIDE CRYS ER 20 MEQ PO TBCR
40.0000 meq | EXTENDED_RELEASE_TABLET | Freq: Two times a day (BID) | ORAL | Status: DC
  Administered 2017-03-07 – 2017-03-13 (×13): 40 meq via ORAL
  Filled 2017-03-07 (×13): qty 2

## 2017-03-07 NOTE — Progress Notes (Signed)
qPhysical Therapy Treatment Patient Details Name: Marie FritzBarbara T Juarez MRN: [id number] DOB: 01/17/1956 Today's Date: 03/07/2017    History of Present Illness 61 year old woman PMH hypertension, hyperlipidemia, lower extremity edema present to the emergency department after being found hypoxic at home by her home health nurse. Initial evaluation revealed acute hypoxic and hypercapnic respiratory failure requiring BiPAP, most likely secondary to CHF based on exam and chest x-ray.  Dx: Acute hypoxic, hypercapnic respiratory failure, multifactorial, acute CHF, suspected underlying COPD without obvious exacerbation, and anasarca with massive volume overload due to acute CHF.  Please note the following: 11/23/2016 Fall on outstretched arm and sustained a R Posterior elbow dislocation with displaced intra-articular fracture of the RIGHT radial head and probable displaced coronoid process ulna fracture.  Pt underwent closed reduction and returned home.  She followed up in the office and was recommended for surgical repair at that time.  12/19/2016 Pt underwent ORIF of R elbow.  02/06/2017 Pt sustained another fall, which opened up her surgical incision and she had with expressible flocculant drainage.  She required an I & D with hardware removal, and was to remain NWB on R UE.  She is scheduled for follow up with orthopedics on 03/18/2017.    PT Comments    Pt received in bed, and was agreeable to PT tx.  Pt was able to ambulate the same distance today, however she demonstrated 2 LOB.  Continue to recommend HHPT with 24/7 supervision/assistance.  Follow Up Recommendations  Home health PT;Supervision/Assistance - 24 hour     Equipment Recommendations  Other (comment) (possibly a quad cane)    Recommendations for Other Services       Precautions / Restrictions Precautions Precautions: Fall Precaution Comments: 2 Restrictions Weight Bearing Restrictions: Yes RUE Weight Bearing: Non weight bearing Other  Position/Activity Restrictions: NWB R UE per Dannielle BurnEric Phillips, PA progress note on 02/09/2017.  Pt expressed that she was unaware of this precaution.     Mobility  Bed Mobility Overal bed mobility: Modified Independent             General bed mobility comments: HOB slightly raised.   Transfers Overall transfer level: Needs assistance Equipment used: Straight cane Transfers: Sit to/from Stand Sit to Stand: Min guard            Ambulation/Gait Ambulation/Gait assistance: Min assist Ambulation Distance (Feet): 200 Feet Assistive device: Straight cane Gait Pattern/deviations: Step-through pattern;Wide base of support   Gait velocity interpretation: <1.8 ft/sec, indicative of risk for recurrent falls General Gait Details: 2 episodes of LOB to the left - pt states that she feels it is because the catheter anchor is rubbing against her leg and hurting when she walks.     Stairs            Wheelchair Mobility    Modified Rankin (Stroke Patients Only)       Balance Overall balance assessment: History of Falls;Needs assistance Sitting-balance support: Single extremity supported;Feet supported Sitting balance-Leahy Scale: Good     Standing balance support: Single extremity supported Standing balance-Leahy Scale: Fair                              Cognition Arousal/Alertness: Awake/alert Behavior During Therapy: WFL for tasks assessed/performed Overall Cognitive Status: Within Functional Limits for tasks assessed  Exercises      General Comments        Pertinent Vitals/Pain Pain Assessment: No/denies pain    Home Living                      Prior Function            PT Goals (current goals can now be found in the care plan section) Acute Rehab PT Goals Patient Stated Goal: To go home PT Goal Formulation: With patient Time For Goal Achievement: 03/12/17 Potential to Achieve  Goals: Fair Progress towards PT goals: Progressing toward goals    Frequency    Min 3X/week      PT Plan Current plan remains appropriate    Co-evaluation             End of Session Equipment Utilized During Treatment: Gait belt;Oxygen Activity Tolerance: Patient tolerated treatment well Patient left: in bed;with call bell/phone within reach Nurse Communication: Mobility status PT Visit Diagnosis: Muscle weakness (generalized) (M62.81);History of falling (Z91.81);Other abnormalities of gait and mobility (R26.89)     Time: 5621-3086 PT Time Calculation (min) (ACUTE ONLY): 18 min  Charges:  $Gait Training: 8-22 mins                    G Codes:       Beth [place] Basham, PT, DPT X: [phone]

## 2017-03-07 NOTE — Care Management Important Message (Signed)
Important Message  Patient Details  Name: Marie Juarez MRN: [id number] Date of Birth: 12/06/1956   Medicare Important Message Given:  Yes    Malcolm MetroChildress, Hagar Sadiq Demske, RN 03/07/2017, 9:35 AM

## 2017-03-07 NOTE — Progress Notes (Signed)
PROGRESS NOTE    Marie Juarez  ZOX:[id number] DOB: [birth year] DOA: 03/03/2017 PCP: Pearson Grippe, MD   Brief Narrative:  61 year old woman PMH hypertension, hyperlipidemia, lower extremity edema present to the emergency department after being found hypoxic at home by her home health nurse. Initial evaluation revealed acute hypoxic and hypercapnic respiratory failure requiring BiPAP, most likely secondary to CHF based on exam and chest x-ray.  Was able to wean off BiPap and only use it at night.    Assessment & Plan:   Principal Problem:   Acute respiratory failure with hypoxia and hypercapnia (HCC) Active Problems:   Acute CHF (congestive heart failure) (HCC)   Anasarca   AKI (acute kidney injury) (HCC)   Thrombocytopenia (HCC)  Acute hypoxic, hypercapnic respiratory failure - Acute CHF  - daily weights, I/O, sodium restriction - output of yesterday (net -11.9L since admission) - underlying COPD without exacerbation. - using BiPap overnight - on 4L [place] today (prior to admission did not require oxygen)   Anasarca - massive volume overload (presumably secondary to acute diastolic CHF) - echocardiogram showing Mild LVH with LVEF 65-70%. Grossly normal diastolic function.   Mild calcified mitral annulus with trivial mitral regurgitation.   Mildly calcified aortic annulus. Trivial tricuspid regurgitation. - lasix 80mg  IV BID continued - no appreciable edema on exam today in abdomen which is improved but still 4+ edema of lower extremities bilaterally  Acute kidney injury - Improved with IV diuresis (Secondary to relative hypoperfusion of the kidney secondary to CHF) - Continue IV  Diuresis - daily BMP  Trivial troponin elevation - No chest pain - EKG nonacute - No further evaluation suggested.  Modest thrombocytopenia. - Etiology and significance unclear - repeat CBCD pending   PMH right elbow fracture, currently on vancomycin for joint infection. - Continue  vancomycin - follow-up with infectious disease and orthopedics as an outpatient.  DVT prophylaxis: SCDs Code Status: full Family Communication: none Disposition Plan: needs HH and more aggressive diuresis  Consultants:   None  Procedures:   Echocardiogram on 3/20  Antimicrobials:   Vancomycin    Subjective: Patient talking on phone at time of exam.  She appears to be breathing slightly easier.  She says she feels like her belly has gotten less swollen.  Still significant swelling of both legs.  Wants to know how much longer she will be in the hospital.  Objective: Vitals:   03/06/17 2347 03/07/17 0500 03/07/17 0900 03/07/17 1459  BP:  100/75 121/71 110/78  Pulse:  78 82 83  Resp:  18  19  Temp:  97.3 F (36.3 C)  98.5 F (36.9 C)  TempSrc:  Axillary  Oral  SpO2: 93% 97%  97%  Weight:    99.4 kg (219 lb 1.6 oz)  Height:        Intake/Output Summary (Last 24 hours) at 03/07/17 1617 Last data filed at 03/07/17 0832  Gross per 24 hour  Intake              606 ml  Output             3475 ml  Net            -2869 ml   Filed Weights   03/06/17 0500 03/06/17 1158 03/07/17 1459  Weight: 103.9 kg (229 lb 0.9 oz) 101.7 kg (224 lb 3.2 oz) 99.4 kg (219 lb 1.6 oz)    Examination:  General exam: appears more calm today Respiratory system: rales in lung  lower lung fields bilaterally, nasal cannula in place, good air movement, no accessory muscle use Cardiovascular system: S1 & S2 heard, RRR. No JVD, murmurs, rubs, gallops or clicks. 4+  Bilateral lower extremity edema to groin Gastrointestinal system: Abdomen is distended, soft and nontender. Edema of lower abdomen, No organomegaly or masses felt. Normal bowel sounds heard. Central nervous system: Alert and oriented. No focal neurological deficits. Extremities: able to move lower extremities independently, able to move L UE independently- PICC line present, R UE non weightbearing and minimal movement as ace wrap in  place Skin: No rashes, lesions or ulcers Psychiatry:  Mood & affect appropriate, questionable insight    Data Reviewed: I have personally reviewed following labs and imaging studies  CBC:  Recent Labs Lab 03/03/17 1133 03/04/17 0619 03/06/17 0441  WBC 7.1 7.8 5.5  NEUTROABS 5.0  --  3.5  HGB 12.8 12.3 12.2  HCT 44.1 42.2 42.4  MCV 87.8 87.4 88.3  PLT 144* 132* 135*   Basic Metabolic Panel:  Recent Labs Lab 03/03/17 1133 03/04/17 0619 03/05/17 0426 03/06/17 0441 03/07/17 0537  NA 135 137 138 142 141  K 5.0 4.6 3.7 3.0* 3.0*  CL 93* 94* 93* 91* 87*  CO2 33* 34* 36* 43* 44*  GLUCOSE 136* 105* 83 78 85  BUN 20 20 18 15 16   CREATININE 1.25* 1.12* 0.91 0.84 0.86  CALCIUM 8.7* 8.1* 8.0* 7.9* 7.8*   GFR: Estimated Creatinine Clearance: 77.6 mL/min (by C-G formula based on SCr of 0.86 mg/dL). Liver Function Tests:  Recent Labs Lab 03/03/17 1133  AST 28  ALT 12*  ALKPHOS 93  BILITOT 1.1  PROT 6.9  ALBUMIN 3.3*   No results for input(s): LIPASE, AMYLASE in the last 168 hours. No results for input(s): AMMONIA in the last 168 hours. Coagulation Profile: No results for input(s): INR, PROTIME in the last 168 hours. Cardiac Enzymes:  Recent Labs Lab 03/03/17 1133 03/03/17 1758 03/04/17 0058 03/04/17 0619  TROPONINI 0.03* 0.03* 0.03* <0.03   BNP (last 3 results) No results for input(s): PROBNP in the last 8760 hours. HbA1C: No results for input(s): HGBA1C in the last 72 hours. CBG: No results for input(s): GLUCAP in the last 168 hours. Lipid Profile: No results for input(s): CHOL, HDL, LDLCALC, TRIG, CHOLHDL, LDLDIRECT in the last 72 hours. Thyroid Function Tests: No results for input(s): TSH, T4TOTAL, FREET4, T3FREE, THYROIDAB in the last 72 hours. Anemia Panel: No results for input(s): VITAMINB12, FOLATE, FERRITIN, TIBC, IRON, RETICCTPCT in the last 72 hours. Sepsis Labs: No results for input(s): PROCALCITON, LATICACIDVEN in the last 168  hours.  Recent Results (from the past 240 hour(s))  C difficile quick scan w PCR reflex     Status: None   Collection Time: 03/05/17  9:30 AM  Result Value Ref Range Status   C Diff antigen NEGATIVE NEGATIVE Final   C Diff toxin NEGATIVE NEGATIVE Final   C Diff interpretation No C. difficile detected.  Final         Radiology Studies: No results found.      Scheduled Meds: . ALPRAZolam  0.5 mg Oral TID  . aspirin EC  81 mg Oral Daily  . Chlorhexidine Gluconate Cloth  6 each Topical Q0600  . furosemide  80 mg Intravenous Q12H  . gabapentin  800 mg Oral QID  . potassium chloride  40 mEq Oral BID  . simvastatin  40 mg Oral QHS  . sodium chloride flush  3 mL Intravenous Q12H  .  sodium chloride flush  3 mL Intravenous Q12H   Continuous Infusions:   LOS: 4 days    Time spent: 35 minutes    Katrinka Blazing, MD [facility] Pager [phone]  If 7PM-7AM, please contact night-coverage [email or web address] Password [facility] 03/07/2017, 4:17 PM

## 2017-03-08 LAB — CBC WITH DIFFERENTIAL/PLATELET
BASOS PCT: 0 %
Basophils Absolute: 0 10*3/uL (ref 0.0–0.1)
EOS ABS: 0.3 10*3/uL (ref 0.0–0.7)
EOS PCT: 5 %
HCT: 44 % (ref 36.0–46.0)
Hemoglobin: 12.4 g/dL (ref 12.0–15.0)
LYMPHS ABS: 1.2 10*3/uL (ref 0.7–4.0)
Lymphocytes Relative: 21 %
MCH: 24.8 pg — AB (ref 26.0–34.0)
MCHC: 28.2 g/dL — AB (ref 30.0–36.0)
MCV: 88 fL (ref 78.0–100.0)
Monocytes Absolute: 0.5 10*3/uL (ref 0.1–1.0)
Monocytes Relative: 9 %
Neutro Abs: 3.8 10*3/uL (ref 1.7–7.7)
Neutrophils Relative %: 65 %
PLATELETS: 135 10*3/uL — AB (ref 150–400)
RBC: 5 MIL/uL (ref 3.87–5.11)
RDW: 17.4 % — ABNORMAL HIGH (ref 11.5–15.5)
WBC: 5.8 10*3/uL (ref 4.0–10.5)

## 2017-03-08 LAB — BASIC METABOLIC PANEL
ANION GAP: 8 (ref 5–15)
BUN: 19 mg/dL (ref 6–20)
CALCIUM: 7.7 mg/dL — AB (ref 8.9–10.3)
CO2: 46 mmol/L — ABNORMAL HIGH (ref 22–32)
CREATININE: 0.89 mg/dL (ref 0.44–1.00)
Chloride: 88 mmol/L — ABNORMAL LOW (ref 101–111)
GFR calc Af Amer: >60 mL/min (ref >60)
Glucose, Bld: 79 mg/dL (ref 65–99)
POTASSIUM: 3.7 mmol/L (ref 3.5–5.1)
Sodium: 142 mmol/L (ref 135–145)

## 2017-03-08 MED ORDER — FUROSEMIDE 10 MG/ML IJ SOLN
40.0000 mg | Freq: Once | INTRAMUSCULAR | Status: AC
  Administered 2017-03-08: 40 mg via INTRAVENOUS
  Filled 2017-03-08: qty 4

## 2017-03-08 NOTE — Final Progress Note (Signed)
NT called me to the room due to the patient taking the patients VS and her O2 sats was reading 50%.  She stated that the patients N/C was not in place.  She replaced the O2 [place].  When I went to assess the patient the patient was cyanotic, but alert.  I assessed the O2 setting and to find that the patients O2 was not hooked to the applicator but instead the BIPAP/CPAP machine O2 line was hooked.  The patient was placed on 4 LPM via N/C and deep breathing exercises was done.  Patients O@ came up to 92% on the 4 lpm and her color returned to normal.  Patient voiced no complaints or concerns at this time.  I will continue to monitor her.  And notify MD as needed.

## 2017-03-08 NOTE — Progress Notes (Signed)
Patient refusing to wear CPAP, says she sleeps better without. Patient on 4L [place] with 02 saturations at 93%. CPAP is at bedside on standby if needed. RT will continue to monitor

## 2017-03-08 NOTE — Progress Notes (Signed)
PROGRESS NOTE    Marie FritzBarbara T Juarez  ZOX:[id number] DOB: 12/28/1955 DOA: 03/03/2017 PCP: Pearson GrippeJames Kim, MD   Brief Narrative:  61 year old woman PMH hypertension, hyperlipidemia, lower extremity edema present to the emergency department after being found hypoxic at home by her home health nurse. Initial evaluation revealed acute hypoxic and hypercapnic respiratory failure requiring BiPAP, most likely secondary to CHF based on exam and chest x-ray.  Was able to wean off BiPap and only use it at night.    Assessment & Plan:   Principal Problem:   Acute respiratory failure with hypoxia and hypercapnia (HCC) Active Problems:   Acute CHF (congestive heart failure) (HCC)   Anasarca   AKI (acute kidney injury) (HCC)   Thrombocytopenia (HCC)  Acute hypoxic, hypercapnic respiratory failure - Acute CHF  - daily weights, I/O, sodium restriction - I/O net of -3267ml yesterday - 14.6122ml since admission - underlying COPD without exacerbation. - using BiPap overnight - on 4L [place] today (prior to admission did not require oxygen) - will start albuterol given wheezing on exam today   Anasarca - massive volume overload (presumably secondary to acute diastolic CHF) - echocardiogram showing Mild LVH with LVEF 65-70%. Grossly normal diastolic function.   Mild calcified mitral annulus with trivial mitral regurgitation.   Mildly calcified aortic annulus. Trivial tricuspid regurgitation. - lasix 80mg  IV BID continued - give additional 40mg  IV this afternoon - no appreciable edema on exam today in abdomen which is improved but still 4+ edema of lower extremities bilaterally  Acute kidney injury - Improved with IV diuresis (Secondary to relative hypoperfusion of the kidney secondary to CHF) - Continue IV  Diuresis - daily BMP  Trivial troponin elevation - No chest pain - EKG nonacute - No further evaluation suggested.  Modest thrombocytopenia. - Etiology and significance unclear - repeat CBCD  pending   PMH right elbow fracture, currently on vancomycin for joint infection. - Continue vancomycin - follow-up with infectious disease and orthopedics as an outpatient.  DVT prophylaxis: SCDs Code Status: full Family Communication: none Disposition Plan: needs HH, pending improvement in anasarca  Consultants:   None  Procedures:   Echocardiogram on 3/20  Antimicrobials:   Vancomycin    Subjective: Patient very upset today about her pain clinic appointment in 4 days.  She is worried that she will be charged $25 for a missed appointment and not get her medications.  She says she doesn't know the name of the clinic or the phone number.  Objective: Vitals:   03/07/17 0900 03/07/17 1459 03/07/17 2158 03/08/17 0315  BP: 121/71 110/78 122/68 123/74  Pulse: 82 83 67 79  Resp:  19 20 18   Temp:  98.5 F (36.9 C) 98.3 F (36.8 C) 98.6 F (37 C)  TempSrc:  Oral Oral Axillary  SpO2:  97% 95% 92%  Weight:  99.4 kg (219 lb 1.6 oz)  101.6 kg (224 lb)  Height:        Intake/Output Summary (Last 24 hours) at 03/08/17 1038 Last data filed at 03/08/17 0900  Gross per 24 hour  Intake              483 ml  Output             2800 ml  Net            -2317 ml   Filed Weights   03/06/17 1158 03/07/17 1459 03/08/17 0315  Weight: 101.7 kg (224 lb 3.2 oz) 99.4 kg (219 lb 1.6 oz) 101.6  kg (224 lb)    Examination:  General exam: sitting in bed, resting comfortably Respiratory system: rales in lung  lower lung fields bilaterally, middle lung field wheezing bilaterally, good air movement, no accessory muscle use Cardiovascular system: S1 & S2 heard, RRR. No JVD, murmurs, rubs, gallops or clicks. 3+  Bilateral lower extremity edema to groin, 2+ edema of hands bilaterally Gastrointestinal system: Abdomen is distended, soft and nontender. Edema of lower abdomen, No organomegaly or masses felt. Normal bowel sounds heard. Central nervous system: Alert and oriented. No focal neurological  deficits. Extremities: able to move lower extremities independently, able to move L UE independently- PICC line present, R UE non weightbearing and minimal movement as ace wrap in place Skin: No rashes, lesions or ulcers Psychiatry:  Emotional throughout exam    Data Reviewed: I have personally reviewed following labs and imaging studies  CBC:  Recent Labs Lab 03/03/17 1133 03/04/17 0619 03/06/17 0441 03/08/17 0622  WBC 7.1 7.8 5.5 5.8  NEUTROABS 5.0  --  3.5 3.8  HGB 12.8 12.3 12.2 12.4  HCT 44.1 42.2 42.4 44.0  MCV 87.8 87.4 88.3 88.0  PLT 144* 132* 135* 135*   Basic Metabolic Panel:  Recent Labs Lab 03/04/17 0619 03/05/17 0426 03/06/17 0441 03/07/17 0537 03/08/17 0622  NA 137 138 142 141 142  K 4.6 3.7 3.0* 3.0* 3.7  CL 94* 93* 91* 87* 88*  CO2 34* 36* 43* 44* 46*  GLUCOSE 105* 83 78 85 79  BUN 20 18 15 16 19   CREATININE 1.12* 0.91 0.84 0.86 0.89  CALCIUM 8.1* 8.0* 7.9* 7.8* 7.7*   GFR: Estimated Creatinine Clearance: 76 mL/min (by C-G formula based on SCr of 0.89 mg/dL). Liver Function Tests:  Recent Labs Lab 03/03/17 1133  AST 28  ALT 12*  ALKPHOS 93  BILITOT 1.1  PROT 6.9  ALBUMIN 3.3*   No results for input(s): LIPASE, AMYLASE in the last 168 hours. No results for input(s): AMMONIA in the last 168 hours. Coagulation Profile: No results for input(s): INR, PROTIME in the last 168 hours. Cardiac Enzymes:  Recent Labs Lab 03/03/17 1133 03/03/17 1758 03/04/17 0058 03/04/17 0619  TROPONINI 0.03* 0.03* 0.03* <0.03   BNP (last 3 results) No results for input(s): PROBNP in the last 8760 hours. HbA1C: No results for input(s): HGBA1C in the last 72 hours. CBG: No results for input(s): GLUCAP in the last 168 hours. Lipid Profile: No results for input(s): CHOL, HDL, LDLCALC, TRIG, CHOLHDL, LDLDIRECT in the last 72 hours. Thyroid Function Tests: No results for input(s): TSH, T4TOTAL, FREET4, T3FREE, THYROIDAB in the last 72 hours. Anemia  Panel: No results for input(s): VITAMINB12, FOLATE, FERRITIN, TIBC, IRON, RETICCTPCT in the last 72 hours. Sepsis Labs: No results for input(s): PROCALCITON, LATICACIDVEN in the last 168 hours.  Recent Results (from the past 240 hour(s))  C difficile quick scan w PCR reflex     Status: None   Collection Time: 03/05/17  9:30 AM  Result Value Ref Range Status   C Diff antigen NEGATIVE NEGATIVE Final   C Diff toxin NEGATIVE NEGATIVE Final   C Diff interpretation No C. difficile detected.  Final         Radiology Studies: No results found.      Scheduled Meds: . ALPRAZolam  0.5 mg Oral TID  . aspirin EC  81 mg Oral Daily  . Chlorhexidine Gluconate Cloth  6 each Topical Q0600  . furosemide  40 mg Intravenous Once  . furosemide  80 mg Intravenous Q12H  . gabapentin  800 mg Oral QID  . potassium chloride  40 mEq Oral BID  . simvastatin  40 mg Oral QHS  . sodium chloride flush  3 mL Intravenous Q12H  . sodium chloride flush  3 mL Intravenous Q12H   Continuous Infusions:   LOS: 5 days    Time spent: 30 minutes    Katrinka Blazing, MD [facility] Pager [phone]  If 7PM-7AM, please contact night-coverage [email or web address] Password [facility] 03/08/2017, 10:38 AM

## 2017-03-09 ENCOUNTER — Inpatient Hospital Stay (HOSPITAL_COMMUNITY): Payer: Medicare Other

## 2017-03-09 LAB — BASIC METABOLIC PANEL
Anion gap: 10 (ref 5–15)
BUN: 28 mg/dL — AB (ref 6–20)
CHLORIDE: 86 mmol/L — AB (ref 101–111)
CO2: 44 mmol/L — AB (ref 22–32)
Calcium: 7.8 mg/dL — ABNORMAL LOW (ref 8.9–10.3)
Creatinine, Ser: 0.96 mg/dL (ref 0.44–1.00)
GFR calc Af Amer: >60 mL/min (ref >60)
GFR calc non Af Amer: >60 mL/min (ref >60)
GLUCOSE: 186 mg/dL — AB (ref 65–99)
Potassium: 4 mmol/L (ref 3.5–5.1)
SODIUM: 140 mmol/L (ref 135–145)

## 2017-03-09 NOTE — Progress Notes (Signed)
Changed the Ace bandage on the patients injured elbow due to it appearing to be too tight.  The incision was cleansed with chlorhexidine and redressed with a pink foam dressing.  There was a dime size amount of old green drainage that was dry.  New Ace bandage placed. The extremity was elevated above the heart and ice was placed.  Patient tolerated the procedure without complaints of pain or discomfort.  She was pre medicated.

## 2017-03-09 NOTE — Progress Notes (Signed)
PROGRESS NOTE    Marie Juarez  ZOX:[id number] DOB: [birth year] DOA: 03/03/2017 PCP: Pearson Grippe, MD   Brief Narrative:  61 year old woman PMH hypertension, hyperlipidemia, lower extremity edema present to the emergency department after being found hypoxic at home by her home health nurse. Initial evaluation revealed acute hypoxic and hypercapnic respiratory failure requiring BiPAP, most likely secondary to CHF based on exam and chest x-ray.  Was able to wean off BiPap and only use it at night. Overnight from 3/24-3/25 patient refused BiPap.   Assessment & Plan:   Principal Problem:   Acute respiratory failure with hypoxia and hypercapnia (HCC) Active Problems:   Acute CHF (congestive heart failure) (HCC)   Anasarca   AKI (acute kidney injury) (HCC)   Thrombocytopenia (HCC)  Acute hypoxic, hypercapnic respiratory failure - Acute CHF  - daily weights, I/O, sodium restriction - I/O net of - yesterday - 17,227ml negative since admission - underlying COPD without exacerbation. - using BiPap overnight - on 4L [place] today (prior to admission did not require oxygen) - repeat chest xray today showing Decreased but not resolved pulmonary vascular congestion/interstitial edema. - continue 80mg  IV last BID   Anasarca - massive volume overload (presumably secondary to acute diastolic CHF) - echocardiogram showing Mild LVH with LVEF 65-70%. Grossly normal diastolic function.   Mild calcified mitral annulus with trivial mitral regurgitation.   Mildly calcified aortic annulus. Trivial tricuspid regurgitation. - lasix 80mg  IV BID continued - improving  Acute kidney injury - Improved with IV diuresis (Secondary to relative hypoperfusion of the kidney secondary to CHF) - Continue IV Diuresis - daily BMP  Trivial troponin elevation - No chest pain - EKG nonacute - No further evaluation suggested.  Modest thrombocytopenia. - Etiology and significance unclear - platelets stable  ~135   PMH right elbow fracture, currently on vancomycin for joint infection. - Continue vancomycin - follow-up with infectious disease and orthopedics as an outpatient.  DVT prophylaxis: SCDs Code Status: full Family Communication: none Disposition Plan: needs HH, pending improvement in anasarca  Consultants:   None  Procedures:   Echocardiogram on 3/20  Antimicrobials:   Vancomycin    Subjective: Patient sitting in bed eating lunch.  Reports she is feeling better.  Mentions she slept relatively well last night.  Per nursing note patient was found without oxygen with saturations of 50% last evening. Denies any shortness of breath. Says her legs are starting to feel better.   Objective: Vitals:   03/08/17 1434 03/08/17 1903 03/08/17 2023 03/09/17 0619  BP: 136/68  123/62 (!) 144/61  Pulse: (!) 110  71 90  Resp: 18  18 18   Temp: 98.5 F (36.9 C)  98.9 F (37.2 C) 98.3 F (36.8 C)  TempSrc:   Oral Oral  SpO2: 93%  94% 91%  Weight:  99.7 kg (219 lb 12.8 oz)  98.7 kg (217 lb 8 oz)  Height:        Intake/Output Summary (Last 24 hours) at 03/09/17 1431 Last data filed at 03/09/17 1145  Gross per 24 hour  Intake              480 ml  Output             3750 ml  Net            -3270 ml   Filed Weights   03/08/17 0315 03/08/17 1903 03/09/17 0619  Weight: 101.6 kg (224 lb) 99.7 kg (219 lb 12.8 oz) 98.7 kg (217 lb 8  oz)    Examination:  General exam: sitting in bed, resting comfortably Respiratory system: scattered fine crackles, middle lung field wheezing bilaterally, good air movement, no accessory muscle use Cardiovascular system: S1 & S2 heard, RRR. No JVD, murmurs, rubs, gallops or clicks. 3+ Bilateral lower extremity edema to mid thighs, 2+ edema of hands bilaterally Gastrointestinal system: Abdomen is obese, distended, soft and nontender. No organomegaly or masses felt. Normal bowel sounds heard. Central nervous system: Alert and oriented. No focal  neurological deficits. Extremities: able to move lower extremities independently, able to move L UE independently- PICC line present, R UE non weightbearing and minimal movement as ace wrap in place Skin: No rashes, lesions or ulcers Psychiatry:  Appropriate mood and affect    Data Reviewed: I have personally reviewed following labs and imaging studies  CBC:  Recent Labs Lab 03/03/17 1133 03/04/17 0619 03/06/17 0441 03/08/17 0622  WBC 7.1 7.8 5.5 5.8  NEUTROABS 5.0  --  3.5 3.8  HGB 12.8 12.3 12.2 12.4  HCT 44.1 42.2 42.4 44.0  MCV 87.8 87.4 88.3 88.0  PLT 144* 132* 135* 135*   Basic Metabolic Panel:  Recent Labs Lab 03/05/17 0426 03/06/17 0441 03/07/17 0537 03/08/17 0622 03/09/17 0950  NA 138 142 141 142 140  K 3.7 3.0* 3.0* 3.7 4.0  CL 93* 91* 87* 88* 86*  CO2 36* 43* 44* 46* 44*  GLUCOSE 83 78 85 79 186*  BUN 18 15 16 19  28*  CREATININE 0.91 0.84 0.86 0.89 0.96  CALCIUM 8.0* 7.9* 7.8* 7.7* 7.8*   GFR: Estimated Creatinine Clearance: 69.2 mL/min (by C-G formula based on SCr of 0.96 mg/dL). Liver Function Tests:  Recent Labs Lab 03/03/17 1133  AST 28  ALT 12*  ALKPHOS 93  BILITOT 1.1  PROT 6.9  ALBUMIN 3.3*   No results for input(s): LIPASE, AMYLASE in the last 168 hours. No results for input(s): AMMONIA in the last 168 hours. Coagulation Profile: No results for input(s): INR, PROTIME in the last 168 hours. Cardiac Enzymes:  Recent Labs Lab 03/03/17 1133 03/03/17 1758 03/04/17 0058 03/04/17 0619  TROPONINI 0.03* 0.03* 0.03* <0.03   BNP (last 3 results) No results for input(s): PROBNP in the last 8760 hours. HbA1C: No results for input(s): HGBA1C in the last 72 hours. CBG: No results for input(s): GLUCAP in the last 168 hours. Lipid Profile: No results for input(s): CHOL, HDL, LDLCALC, TRIG, CHOLHDL, LDLDIRECT in the last 72 hours. Thyroid Function Tests: No results for input(s): TSH, T4TOTAL, FREET4, T3FREE, THYROIDAB in the last 72  hours. Anemia Panel: No results for input(s): VITAMINB12, FOLATE, FERRITIN, TIBC, IRON, RETICCTPCT in the last 72 hours. Sepsis Labs: No results for input(s): PROCALCITON, LATICACIDVEN in the last 168 hours.  Recent Results (from the past 240 hour(s))  C difficile quick scan w PCR reflex     Status: None   Collection Time: 03/05/17  9:30 AM  Result Value Ref Range Status   C Diff antigen NEGATIVE NEGATIVE Final   C Diff toxin NEGATIVE NEGATIVE Final   C Diff interpretation No C. difficile detected.  Final         Radiology Studies: Dg Chest Port 1 View  Result Date: 03/09/2017 CLINICAL DATA:  61 year old female with lower extremity swelling, shortness of breath. EXAM: PORTABLE CHEST 1 VIEW COMPARISON:  Portable chest 03/03/2017 and earlier. FINDINGS: Portable AP upright view at 1209 hours. Stable left PICC line. Chronic elevation of the right hemidiaphragm suspected, but there is probably a  superimposed small right pleural effusion. There is trace pleural fluid in the minor fissure. There is streaky medial bilateral lung base opacity, and curvilinear left mid lung opacity. No left pleural effusion. Bilateral pulmonary vascular congestion has mildly decreased but not resolved. Stable cardiomegaly and mediastinal contours. Visualized tracheal air column is within normal limits. No pneumothorax. Negative visible bowel gas pattern. IMPRESSION: 1. Decreased but not resolved pulmonary vascular congestion/interstitial edema. 2. Small right pleural effusion suspected, superimposed on right hemidiaphragm elevation. 3. Nonspecific streaky medial bilateral lung base opacity. Favor lower lobe atelectasis, in conjunction with mild left perihilar atelectasis. Electronically Signed   By: Odessa Fleming M.D.   On: 03/09/2017 14:24        Scheduled Meds: . ALPRAZolam  0.5 mg Oral TID  . aspirin EC  81 mg Oral Daily  . Chlorhexidine Gluconate Cloth  6 each Topical Q0600  . furosemide  80 mg Intravenous Q12H   . gabapentin  800 mg Oral QID  . potassium chloride  40 mEq Oral BID  . simvastatin  40 mg Oral QHS  . sodium chloride flush  3 mL Intravenous Q12H  . sodium chloride flush  3 mL Intravenous Q12H   Continuous Infusions:   LOS: 6 days    Time spent: 30 minutes    Katrinka Blazing, MD [facility] Pager [phone]  If 7PM-7AM, please contact night-coverage [email or web address] Password [facility] 03/09/2017, 2:31 PM

## 2017-03-10 LAB — BASIC METABOLIC PANEL
Anion gap: 8 (ref 5–15)
BUN: 25 mg/dL — ABNORMAL HIGH (ref 6–20)
CHLORIDE: 88 mmol/L — AB (ref 101–111)
CO2: 45 mmol/L — AB (ref 22–32)
Calcium: 8 mg/dL — ABNORMAL LOW (ref 8.9–10.3)
Creatinine, Ser: 0.92 mg/dL (ref 0.44–1.00)
GFR calc non Af Amer: >60 mL/min (ref >60)
Glucose, Bld: 89 mg/dL (ref 65–99)
POTASSIUM: 4.3 mmol/L (ref 3.5–5.1)
SODIUM: 141 mmol/L (ref 135–145)

## 2017-03-10 MED ORDER — FUROSEMIDE 10 MG/ML IJ SOLN
40.0000 mg | Freq: Once | INTRAMUSCULAR | Status: AC
  Administered 2017-03-10: 40 mg via INTRAVENOUS
  Filled 2017-03-10: qty 4

## 2017-03-10 NOTE — Progress Notes (Signed)
SATURATION QUALIFICATIONS: (This note is used to comply with regulatory documentation for home oxygen)  Patient Saturations on Room Air at Rest = 86%  Patient Saturations on Room Air while Ambulating = 84%  Patient Saturations on 2 Liters of oxygen while Ambulating = 91%

## 2017-03-10 NOTE — Progress Notes (Signed)
PROGRESS NOTE    Marie Juarez  [id number]:[id number] DOB: 16-Jan-1956 DOA: 03/03/2017 PCP: Pearson Grippe, MD   Brief Narrative:  61 year old woman PMH hypertension, hyperlipidemia, lower extremity edema present to the emergency department after being found hypoxic at home by her home health nurse. Initial evaluation revealed acute hypoxic and hypercapnic respiratory failure requiring BiPAP, most likely secondary to CHF based on exam and chest x-ray.  Was able to wean off BiPap and only use it at night. Was aggressively diuresed during hospitalization.   Assessment & Plan:   Principal Problem:   Acute respiratory failure with hypoxia and hypercapnia (HCC) Active Problems:   Acute CHF (congestive heart failure) (HCC)   Anasarca   AKI (acute kidney injury) (HCC)   Thrombocytopenia (HCC)  Acute hypoxic, hypercapnic respiratory failure - Acute CHF  - daily weights, I/O, sodium restriction - I/O net of - yesterday - 19,553ml negative since admission - underlying COPD without exacerbation. - on 4L [place] today (prior to admission did not require oxygen) - repeat chest xray today showing Decreased but not resolved pulmonary vascular congestion/interstitial edema. - continue 80mg  IV last BID but will give additional 40mg  IV lasix today   Anasarca - massive volume overload (presumably secondary to acute diastolic CHF) - echocardiogram showing Mild LVH with LVEF 65-70%. Grossly normal diastolic function.   Mild calcified mitral annulus with trivial mitral regurgitation.   Mildly calcified aortic annulus. Trivial tricuspid regurgitation. - lasix 80mg  IV BID continued with additional 40mg  IV lasix this afternoon - improving but still has significant overload  Acute kidney injury - Improved with IV diuresis (Secondary to relative hypoperfusion of the kidney secondary to CHF) - Continue IV Diuresis - daily BMP - can back off diuresis when kidney function decreases  Trivial troponin  elevation - No chest pain - EKG nonacute - No further evaluation suggested.  Modest thrombocytopenia. - Etiology and significance unclear - platelets stable ~135   PMH right elbow fracture, currently on vancomycin for joint infection. - Continue vancomycin - follow-up with infectious disease and orthopedics as an outpatient.  DVT prophylaxis: SCDs Code Status: full Family Communication: none Disposition Plan: needs HH, pending improvement in anasarca; likely discharge in 2-3 days  Consultants:   None  Procedures:   Echocardiogram on 3/20  Antimicrobials:   Vancomycin    Subjective: Patient very concerned about her outpatient pain specialist appointment tomorrow.  Good urine output and good diuresis yesterday.  No problems with breathing noted overnight- patient wore nasal cannula only overnight.  Objective: Vitals:   03/09/17 1400 03/09/17 2016 03/10/17 0300 03/10/17 0500  BP: 135/74 138/64 126/69   Pulse: 88 69 86   Resp: 18 18 19    Temp: 98.2 F (36.8 C) 98.3 F (36.8 C) 98.2 F (36.8 C)   TempSrc:  Oral Oral   SpO2: 98% 94% 93%   Weight:    95.8 kg (211 lb 4 oz)  Height:        Intake/Output Summary (Last 24 hours) at 03/10/17 1138 Last data filed at 03/10/17 0300  Gross per 24 hour  Intake                0 ml  Output             4050 ml  Net            -4050 ml   Filed Weights   03/08/17 1903 03/09/17 0619 03/10/17 0500  Weight: 99.7 kg (219 lb 12.8 oz) 98.7 kg (217  lb 8 oz) 95.8 kg (211 lb 4 oz)    Examination:  General exam: sitting in bed, resting comfortably Respiratory system: scattered fine crackles, middle lung field wheezing bilaterally, good air movement, no accessory muscle use Cardiovascular system: S1 & S2 heard, RRR. No JVD, murmurs, rubs, gallops or clicks. 3+ Bilateral lower extremity edema to mid thighs, edema of hands resolved Gastrointestinal system: Abdomen is obese, distended, soft and nontender. No organomegaly or masses  felt. Normal bowel sounds heard. Central nervous system: Alert and oriented. No focal neurological deficits. Extremities: able to move lower extremities independently, able to move L UE independently- PICC line present, R UE non weightbearing and minimal movement as ace wrap in place Skin: No rashes, lesions or ulcers Psychiatry:  Appropriate mood and affect    Data Reviewed: I have personally reviewed following labs and imaging studies  CBC:  Recent Labs Lab 03/04/17 0619 03/06/17 0441 03/08/17 0622  WBC 7.8 5.5 5.8  NEUTROABS  --  3.5 3.8  HGB 12.3 12.2 12.4  HCT 42.2 42.4 44.0  MCV 87.4 88.3 88.0  PLT 132* 135* 135*   Basic Metabolic Panel:  Recent Labs Lab 03/06/17 0441 03/07/17 0537 03/08/17 0622 03/09/17 0950 03/10/17 0736  NA 142 141 142 140 141  K 3.0* 3.0* 3.7 4.0 4.3  CL 91* 87* 88* 86* 88*  CO2 43* 44* 46* 44* 45*  GLUCOSE 78 85 79 186* 89  BUN 15 16 19  28* 25*  CREATININE 0.84 0.86 0.89 0.96 0.92  CALCIUM 7.9* 7.8* 7.7* 7.8* 8.0*   GFR: Estimated Creatinine Clearance: 71.1 mL/min (by C-G formula based on SCr of 0.92 mg/dL). Liver Function Tests: No results for input(s): AST, ALT, ALKPHOS, BILITOT, PROT, ALBUMIN in the last 168 hours. No results for input(s): LIPASE, AMYLASE in the last 168 hours. No results for input(s): AMMONIA in the last 168 hours. Coagulation Profile: No results for input(s): INR, PROTIME in the last 168 hours. Cardiac Enzymes:  Recent Labs Lab 03/03/17 1758 03/04/17 0058 03/04/17 0619  TROPONINI 0.03* 0.03* <0.03   BNP (last 3 results) No results for input(s): PROBNP in the last 8760 hours. HbA1C: No results for input(s): HGBA1C in the last 72 hours. CBG: No results for input(s): GLUCAP in the last 168 hours. Lipid Profile: No results for input(s): CHOL, HDL, LDLCALC, TRIG, CHOLHDL, LDLDIRECT in the last 72 hours. Thyroid Function Tests: No results for input(s): TSH, T4TOTAL, FREET4, T3FREE, THYROIDAB in the last 72  hours. Anemia Panel: No results for input(s): VITAMINB12, FOLATE, FERRITIN, TIBC, IRON, RETICCTPCT in the last 72 hours. Sepsis Labs: No results for input(s): PROCALCITON, LATICACIDVEN in the last 168 hours.  Recent Results (from the past 240 hour(s))  C difficile quick scan w PCR reflex     Status: None   Collection Time: 03/05/17  9:30 AM  Result Value Ref Range Status   C Diff antigen NEGATIVE NEGATIVE Final   C Diff toxin NEGATIVE NEGATIVE Final   C Diff interpretation No C. difficile detected.  Final         Radiology Studies: Dg Chest Port 1 View  Result Date: 03/09/2017 CLINICAL DATA:  61 year old female with lower extremity swelling, shortness of breath. EXAM: PORTABLE CHEST 1 VIEW COMPARISON:  Portable chest 03/03/2017 and earlier. FINDINGS: Portable AP upright view at 1209 hours. Stable left PICC line. Chronic elevation of the right hemidiaphragm suspected, but there is probably a superimposed small right pleural effusion. There is trace pleural fluid in the minor fissure. There  is streaky medial bilateral lung base opacity, and curvilinear left mid lung opacity. No left pleural effusion. Bilateral pulmonary vascular congestion has mildly decreased but not resolved. Stable cardiomegaly and mediastinal contours. Visualized tracheal air column is within normal limits. No pneumothorax. Negative visible bowel gas pattern. IMPRESSION: 1. Decreased but not resolved pulmonary vascular congestion/interstitial edema. 2. Small right pleural effusion suspected, superimposed on right hemidiaphragm elevation. 3. Nonspecific streaky medial bilateral lung base opacity. Favor lower lobe atelectasis, in conjunction with mild left perihilar atelectasis. Electronically Signed   By: Odessa Fleming M.D.   On: 03/09/2017 14:24        Scheduled Meds: . ALPRAZolam  0.5 mg Oral TID  . aspirin EC  81 mg Oral Daily  . Chlorhexidine Gluconate Cloth  6 each Topical Q0600  . furosemide  40 mg Intravenous Once   . furosemide  80 mg Intravenous Q12H  . gabapentin  800 mg Oral QID  . potassium chloride  40 mEq Oral BID  . simvastatin  40 mg Oral QHS  . sodium chloride flush  3 mL Intravenous Q12H  . sodium chloride flush  3 mL Intravenous Q12H   Continuous Infusions:   LOS: 7 days    Time spent: 30 minutes    Katrinka Blazing, MD [facility] Pager [phone]  If 7PM-7AM, please contact night-coverage [email or web address] Password [facility] 03/10/2017, 11:38 AM

## 2017-03-10 NOTE — Progress Notes (Signed)
qPhysical Therapy Treatment Patient Details Name: KALI DEADWYLER MRN: [id number] DOB: [birth year] Today's Date: 03/10/2017    History of Present Illness 61 year old woman PMH hypertension, hyperlipidemia, lower extremity edema present to the emergency department after being found hypoxic at home by her home health nurse. Initial evaluation revealed acute hypoxic and hypercapnic respiratory failure requiring BiPAP, most likely secondary to CHF based on exam and chest x-ray.  Dx: Acute hypoxic, hypercapnic respiratory failure, multifactorial, acute CHF, suspected underlying COPD without obvious exacerbation, and anasarca with massive volume overload due to acute CHF.  Please note the following: 11/23/2016 Fall on outstretched arm and sustained a R Posterior elbow dislocation with displaced intra-articular fracture of the RIGHT radial head and probable displaced coronoid process ulna fracture.  Pt underwent closed reduction and returned home.  She followed up in the office and was recommended for surgical repair at that time.  12/19/2016 Pt underwent ORIF of R elbow.  02/06/2017 Pt sustained another fall, which opened up her surgical incision and she had with expressible flocculant drainage.  She required an I & D with hardware removal, and was to remain NWB on R UE.  She is scheduled for follow up with orthopedics on 03/18/2017.    PT Comments    Pt received in bed, and was agreeable to PT tx.  Upon arrival, pt's SpO2 was 85% while she was at rest on 1L.  Improved when increased to 2L with deep breathing.  During ambulation she required 3L and was still dropping down to 85%, but with cues for deep breathing she was able to improve back to 93%.  She ambulated 250ft with 1 standing rest break due to fatigue.  She continues to demonstrate unsteadiness.  Recommend HHPT upon d/c, as well as 24/7 supervision/assistance.   Follow Up Recommendations  Home health PT;Supervision/Assistance - 24 hour     Equipment  Recommendations  Other (comment) (quad cane)    Recommendations for Other Services       Precautions / Restrictions Precautions Precautions: Fall Precaution Comments: 2 Restrictions Weight Bearing Restrictions: Yes RUE Weight Bearing: Non weight bearing Other Position/Activity Restrictions: NWB R UE per Dannielle Burn, PA progress note on 02/09/2017.  Pt expressed that she was unaware of this precaution.     Mobility  Bed Mobility Overal bed mobility: Needs Assistance Bed Mobility: Supine to Sit     Supine to sit: Supervision     General bed mobility comments: HOB slightly raised. Increased time  Transfers Overall transfer level: Needs assistance Equipment used: Straight cane Transfers: Sit to/from Stand Sit to Stand: Min guard            Ambulation/Gait Ambulation/Gait assistance: Min assist Ambulation Distance (Feet): 200 Feet Assistive device: Straight cane Gait Pattern/deviations: Step-through pattern;Wide base of support   Gait velocity interpretation: <1.8 ft/sec, indicative of risk for recurrent falls General Gait Details: 1 standing rest break due to SpO2 desaturation to 85% while on 3L.  Pt is able to improve with deep breathing.   Stairs            Wheelchair Mobility    Modified Rankin (Stroke Patients Only)       Balance Overall balance assessment: History of Falls;Needs assistance Sitting-balance support: Single extremity supported;Feet supported Sitting balance-Leahy Scale: Good     Standing balance support: Single extremity supported Standing balance-Leahy Scale: Fair  Cognition Arousal/Alertness: Awake/alert Behavior During Therapy: WFL for tasks assessed/performed Overall Cognitive Status: Within Functional Limits for tasks assessed                                        Exercises      General Comments        Pertinent Vitals/Pain Pain Assessment: No/denies  pain    Home Living                      Prior Function            PT Goals (current goals can now be found in the care plan section) Acute Rehab PT Goals Patient Stated Goal: To go home PT Goal Formulation: With patient Time For Goal Achievement: 03/12/17 Potential to Achieve Goals: Fair Progress towards PT goals: Progressing toward goals    Frequency    Min 3X/week      PT Plan Current plan remains appropriate    Co-evaluation             End of Session Equipment Utilized During Treatment: Gait belt;Oxygen Activity Tolerance: Patient tolerated treatment well   Nurse Communication: Mobility status PT Visit Diagnosis: Muscle weakness (generalized) (M62.81);History of falling (Z91.81);Other abnormalities of gait and mobility (R26.89)     Time: 1600-1630 PT Time Calculation (min) (ACUTE ONLY): 30 min  Charges:  $Gait Training: 23-37 mins                    G Codes:       Beth Keigan Girten, PT, DPT X: [phone]

## 2017-03-10 NOTE — Progress Notes (Signed)
Patient states she slept better without BIPAP last night. Patient on 4LHFNC with 02 saturations at 94%. Will pull bipap out of room if she does not wear tomorrow. RT will continue to monitor.

## 2017-03-11 LAB — CBC
HEMATOCRIT: 44.5 % (ref 36.0–46.0)
Hemoglobin: 12.8 g/dL (ref 12.0–15.0)
MCH: 25.2 pg — AB (ref 26.0–34.0)
MCHC: 28.8 g/dL — AB (ref 30.0–36.0)
MCV: 87.8 fL (ref 78.0–100.0)
Platelets: 179 10*3/uL (ref 150–400)
RBC: 5.07 MIL/uL (ref 3.87–5.11)
RDW: 17.6 % — ABNORMAL HIGH (ref 11.5–15.5)
WBC: 6.9 10*3/uL (ref 4.0–10.5)

## 2017-03-11 LAB — BASIC METABOLIC PANEL
ANION GAP: 8 (ref 5–15)
BUN: 28 mg/dL — ABNORMAL HIGH (ref 6–20)
CALCIUM: 8.1 mg/dL — AB (ref 8.9–10.3)
CO2: 43 mmol/L — AB (ref 22–32)
Chloride: 88 mmol/L — ABNORMAL LOW (ref 101–111)
Creatinine, Ser: 0.92 mg/dL (ref 0.44–1.00)
GFR calc Af Amer: >60 mL/min (ref >60)
GFR calc non Af Amer: >60 mL/min (ref >60)
GLUCOSE: 193 mg/dL — AB (ref 65–99)
Potassium: 4.4 mmol/L (ref 3.5–5.1)
Sodium: 139 mmol/L (ref 135–145)

## 2017-03-11 LAB — CREATININE, SERUM
Creatinine, Ser: 1.14 mg/dL — ABNORMAL HIGH (ref 0.44–1.00)
GFR calc Af Amer: 60 mL/min — ABNORMAL LOW (ref >60)
GFR calc non Af Amer: 52 mL/min — ABNORMAL LOW (ref >60)

## 2017-03-11 MED ORDER — ENOXAPARIN SODIUM 40 MG/0.4ML ~~LOC~~ SOLN
40.0000 mg | SUBCUTANEOUS | Status: DC
  Administered 2017-03-11 – 2017-03-12 (×2): 40 mg via SUBCUTANEOUS
  Filled 2017-03-11 (×2): qty 0.4

## 2017-03-11 MED ORDER — FUROSEMIDE 10 MG/ML IJ SOLN
100.0000 mg | Freq: Two times a day (BID) | INTRAVENOUS | Status: DC
  Administered 2017-03-11 – 2017-03-12 (×2): 100 mg via INTRAVENOUS
  Filled 2017-03-11 (×2): qty 10

## 2017-03-11 MED ORDER — FUROSEMIDE 10 MG/ML IJ SOLN
40.0000 mg | Freq: Once | INTRAMUSCULAR | Status: AC
  Administered 2017-03-11: 40 mg via INTRAVENOUS
  Filled 2017-03-11: qty 4

## 2017-03-11 NOTE — Progress Notes (Signed)
PROGRESS NOTE    Marie Juarez  ZOX:[id number] DOB: 1956/01/30 DOA: 03/03/2017 PCP: Pearson Grippe, MD   Brief Narrative:  61 year old woman PMH hypertension, hyperlipidemia, lower extremity edema present to the emergency department after being found hypoxic at home by her home health nurse. Initial evaluation revealed acute hypoxic and hypercapnic respiratory failure requiring BiPAP, most likely secondary to CHF based on exam and chest x-ray.  Was able to wean off BiPap and only use it at night. Was aggressively diuresed during hospitalization.   Assessment & Plan:   Principal Problem:   Acute respiratory failure with hypoxia and hypercapnia (HCC) Active Problems:   Acute CHF (congestive heart failure) (HCC)   Anasarca   AKI (acute kidney injury) (HCC)   Thrombocytopenia (HCC)  Acute hypoxic, hypercapnic respiratory failure - Acute CHF  - daily weights, I/O, sodium restriction - I/O net of - yesterday - 24,240ml negative since admission - underlying COPD without exacerbation. - on 4L [place] today (prior to admission did not require oxygen) - repeat chest xray today showing Decreased but not resolved pulmonary vascular congestion/interstitial edema. -lasix 100mg  IV BID  Anasarca - massive volume overload (presumably secondary to acute diastolic CHF) - echocardiogram showing Mild LVH with LVEF 65-70%. Grossly normal diastolic function.   Mild calcified mitral annulus with trivial mitral regurgitation.   Mildly calcified aortic annulus. Trivial tricuspid regurgitation. - lasix 100mg  IV BID continued with additional 40mg  IV lasix this afternoon - improving but still has significant overload  Acute kidney injury - Improved with IV diuresis (Secondary to relative hypoperfusion of the kidney secondary to CHF) - Continue IV Diuresis - daily BMP - increase lasix to 100mg  IV BID  Trivial troponin elevation - No chest pain - EKG nonacute - No further evaluation  suggested.  Modest thrombocytopenia. - Etiology and significance unclear - platelets stable ~135   PMH right elbow fracture, currently on vancomycin for joint infection. - finished course - follow-up with infectious disease and orthopedics as an outpatient - patient reports her follow up appointments are April 5 and April 9  DVT prophylaxis: SCDs Code Status: full Family Communication: none Disposition Plan: anticipate discharge in 1-2 days  Consultants:   None  Procedures:   Echocardiogram on 3/20  Antimicrobials:   Vancomycin    Subjective: Patient sitting up in bed and states she feels well.  Discussed that patient has repeatedly been found without oxygen and a very low oxygen saturation.  Patient voices understanding and says that now that she has a long enough tube to get to and from the bathroom she feels as though she will take it off less.   Objective: Vitals:   03/11/17 0150 03/11/17 0203 03/11/17 0300 03/11/17 0534  BP:  139/66  140/65  Pulse:  (!) 103  88  Resp:    18  Temp:    98.7 F (37.1 C)  TempSrc:    Oral  SpO2: (!) 41% 92% 94% 94%  Weight:    95.8 kg (211 lb 3.2 oz)  Height:        Intake/Output Summary (Last 24 hours) at 03/11/17 1442 Last data filed at 03/11/17 1300  Gross per 24 hour  Intake              480 ml  Output             5150 ml  Net            -4670 ml   Filed Weights   03/09/17  [id number] 03/10/17 0500 03/11/17 0534  Weight: 98.7 kg (217 lb 8 oz) 95.8 kg (211 lb 4 oz) 95.8 kg (211 lb 3.2 oz)    Examination:  General exam: sitting in bed, resting comfortably Respiratory system: scattered fine crackles, good air movement, no accessory muscle use, few scattered expiratory wheezes Cardiovascular system: S1 & S2 heard, RRR. No JVD, murmurs, rubs, gallops or clicks. 3+ Bilateral lower extremity edema to mid thighs, edema of hands resolved Gastrointestinal system: Abdomen is obese, distended, soft and nontender. No organomegaly or  masses felt. Normal bowel sounds heard. Central nervous system: Alert and oriented. No focal neurological deficits. Extremities: able to move lower extremities independently, able to move L UE independently- PICC line present, R UE non weightbearing and minimal movement as ace wrap in place Skin: No rashes, lesions or ulcers Psychiatry:  Appropriate mood and affect    Data Reviewed: I have personally reviewed following labs and imaging studies  CBC:  Recent Labs Lab 03/06/17 0441 03/08/17 0622  WBC 5.5 5.8  NEUTROABS 3.5 3.8  HGB 12.2 12.4  HCT 42.4 44.0  MCV 88.3 88.0  PLT 135* 135*   Basic Metabolic Panel:  Recent Labs Lab 03/07/17 0537 03/08/17 0622 03/09/17 0950 03/10/17 0736 03/11/17 0826  NA 141 142 140 141 139  K 3.0* 3.7 4.0 4.3 4.4  CL 87* 88* 86* 88* 88*  CO2 44* 46* 44* 45* 43*  GLUCOSE 85 79 186* 89 193*  BUN 16 19 28* 25* 28*  CREATININE 0.86 0.89 0.96 0.92 0.92  CALCIUM 7.8* 7.7* 7.8* 8.0* 8.1*   GFR: Estimated Creatinine Clearance: 71.1 mL/min (by C-G formula based on SCr of 0.92 mg/dL). Liver Function Tests: No results for input(s): AST, ALT, ALKPHOS, BILITOT, PROT, ALBUMIN in the last 168 hours. No results for input(s): LIPASE, AMYLASE in the last 168 hours. No results for input(s): AMMONIA in the last 168 hours. Coagulation Profile: No results for input(s): INR, PROTIME in the last 168 hours. Cardiac Enzymes: No results for input(s): CKTOTAL, CKMB, CKMBINDEX, TROPONINI in the last 168 hours. BNP (last 3 results) No results for input(s): PROBNP in the last 8760 hours. HbA1C: No results for input(s): HGBA1C in the last 72 hours. CBG: No results for input(s): GLUCAP in the last 168 hours. Lipid Profile: No results for input(s): CHOL, HDL, LDLCALC, TRIG, CHOLHDL, LDLDIRECT in the last 72 hours. Thyroid Function Tests: No results for input(s): TSH, T4TOTAL, FREET4, T3FREE, THYROIDAB in the last 72 hours. Anemia Panel: No results for input(s):  VITAMINB12, FOLATE, FERRITIN, TIBC, IRON, RETICCTPCT in the last 72 hours. Sepsis Labs: No results for input(s): PROCALCITON, LATICACIDVEN in the last 168 hours.  Recent Results (from the past 240 hour(s))  C difficile quick scan w PCR reflex     Status: None   Collection Time: 03/05/17  9:30 AM  Result Value Ref Range Status   C Diff antigen NEGATIVE NEGATIVE Final   C Diff toxin NEGATIVE NEGATIVE Final   C Diff interpretation No C. difficile detected.  Final         Radiology Studies: No results found.      Scheduled Meds: . ALPRAZolam  0.5 mg Oral TID  . aspirin EC  81 mg Oral Daily  . Chlorhexidine Gluconate Cloth  6 each Topical Q0600  . furosemide  100 mg Intravenous Q12H  . gabapentin  800 mg Oral QID  . potassium chloride  40 mEq Oral BID  . simvastatin  40 mg Oral QHS  .  sodium chloride flush  3 mL Intravenous Q12H  . sodium chloride flush  3 mL Intravenous Q12H   Continuous Infusions:   LOS: 8 days    Time spent: 25 minutes    Katrinka Blazing, MD [facility] Pager [phone]  If 7PM-7AM, please contact night-coverage [email or web address] Password [email or web address] 03/11/2017, 2:42 PM

## 2017-03-11 NOTE — Progress Notes (Signed)
Checked on pt for BIPAP and check spo2 41% on room air.. Pt asleep and O2 applied at 2lpm..spo2 increased to 60's but increased o2 to 4lpm cann spo2 93%. informed pt how important it is to keep o2 on at all times .Marland Kitchen. Nurse Marian Sorrowlga informed. Will continue to monitor pt through the night.

## 2017-03-11 NOTE — Care Management Note (Signed)
Case Management Note  Patient Details  Name: Mliss FritzBarbara T Bouwens MRN: [id number] Date of Birth: 08/17/1956  If discussed at Long Length of Stay Meetings, dates discussed:  03/11/2017   Malcolm MetroChildress, Yitzchak Kothari Demske, RN 03/11/2017, 2:06 PM

## 2017-03-12 ENCOUNTER — Inpatient Hospital Stay (HOSPITAL_COMMUNITY): Payer: Medicare Other

## 2017-03-12 DIAGNOSIS — L539 Erythematous condition, unspecified: Secondary | ICD-10-CM

## 2017-03-12 LAB — BASIC METABOLIC PANEL
Anion gap: 5 (ref 5–15)
BUN: 27 mg/dL — AB (ref 6–20)
CALCIUM: 8.5 mg/dL — AB (ref 8.9–10.3)
CO2: 49 mmol/L — ABNORMAL HIGH (ref 22–32)
CREATININE: 1.08 mg/dL — AB (ref 0.44–1.00)
Chloride: 86 mmol/L — ABNORMAL LOW (ref 101–111)
GFR calc Af Amer: >60 mL/min (ref >60)
GFR calc non Af Amer: 55 mL/min — ABNORMAL LOW (ref >60)
GLUCOSE: 133 mg/dL — AB (ref 65–99)
Potassium: 4.1 mmol/L (ref 3.5–5.1)
Sodium: 140 mmol/L (ref 135–145)

## 2017-03-12 MED ORDER — DIPHENHYDRAMINE HCL 25 MG PO CAPS
25.0000 mg | ORAL_CAPSULE | Freq: Once | ORAL | Status: AC
  Administered 2017-03-12: 25 mg via ORAL
  Filled 2017-03-12: qty 1

## 2017-03-12 MED ORDER — FUROSEMIDE 80 MG PO TABS
80.0000 mg | ORAL_TABLET | Freq: Every day | ORAL | Status: DC
  Administered 2017-03-13: 80 mg via ORAL
  Filled 2017-03-12: qty 1

## 2017-03-12 MED ORDER — DIPHENHYDRAMINE HCL 25 MG PO CAPS
50.0000 mg | ORAL_CAPSULE | Freq: Four times a day (QID) | ORAL | Status: DC | PRN
  Administered 2017-03-12 – 2017-03-13 (×2): 50 mg via ORAL
  Filled 2017-03-12 (×2): qty 2

## 2017-03-12 MED ORDER — FUROSEMIDE 80 MG PO TABS
80.0000 mg | ORAL_TABLET | Freq: Two times a day (BID) | ORAL | Status: DC
  Administered 2017-03-12: 80 mg via ORAL
  Filled 2017-03-12: qty 1

## 2017-03-12 NOTE — Progress Notes (Addendum)
SATURATION QUALIFICATIONS: (This note is used to comply with regulatory documentation for home oxygen)  Patient Saturations on Room Air at Rest = 87%  Patient Saturations on Room Air while Ambulating = 70%  Patient Saturations on  Liters of oxygen while Ambulating = 94% on 4L  Please briefly explain why patient needs home oxygen: O2 sats dropped quickly when patient ambulated on RA, increased with 4L O2 [place] applied

## 2017-03-12 NOTE — Progress Notes (Signed)
PROGRESS NOTE    Marie Juarez  ZOX:[id number] DOB: [birth year] DOA: 03/03/2017 PCP: Pearson Grippe, MD   Brief Narrative:  61 year old woman PMH hypertension, hyperlipidemia, lower extremity edema present to the emergency department after being found hypoxic at home by her home health nurse. Initial evaluation revealed acute hypoxic and hypercapnic respiratory failure requiring BiPAP, most likely secondary to CHF based on exam and chest x-ray.  Was able to wean off BiPap and only use it at night. Was aggressively diuresed during hospitalization.   Assessment & Plan:   Principal Problem:   Acute respiratory failure with hypoxia and hypercapnia (HCC) Active Problems:   Acute CHF (congestive heart failure) (HCC)   Anasarca   AKI (acute kidney injury) (HCC)   Thrombocytopenia (HCC)  Acute hypoxic, hypercapnic respiratory failure - Acute CHF  - daily weights, I/O, sodium restriction - I/O net of - yesterday - 28,328ml negative since admission - underlying COPD without exacerbation. - on 4L [place] today (prior to admission did not require oxygen) - repeat chest xray showing Decreased but not resolved pulmonary vascular congestion/interstitial edema. -as creatinine increased will transition to PO lasix of 80mg  PO BID  Erythematous blancheable rash - appears to be a heat rash - benadryl ordered - cool packs   Anasarca - massive volume overload (presumably secondary to acute diastolic CHF) - echocardiogram showing Mild LVH with LVEF 65-70%. Grossly normal diastolic function. Mild calcified mitral annulus with trivial mitral regurgitation. Mildly calcified aortic annulus. Trivial tricuspid regurgitation. - almost at dry weight  Acute kidney injury - Improved with IV diuresis (Secondary to relative hypoperfusion of the kidney secondary to CHF) - increase in creatinine yesterday - transition to PO lasix  Trivial troponin elevation - No chest pain - EKG nonacute - No further  evaluation suggested.  Modest thrombocytopenia. - Etiology and significance unclear - platelets stable ~135   PMH right elbow fracture, currently on vancomycin for joint infection. - finished course - follow-up with infectious disease and orthopedics as an outpatient - patient reports her follow up appointments are April 5 and April 9  DVT prophylaxis: SCDs and lovenox Code Status: full Family Communication: none Disposition Plan: anticipate discharge in 1-2 days  Consultants:   None  Procedures:   Echocardiogram on 3/20  Antimicrobials:   Vancomycin    Subjective: Patient sitting up in bed.  Nursing staff noted rash on patient's legs, particularly on thighs, abdomen and slightly of the back.  No new medications.  Patient also appears to have a contact reaction at site of ace wrap on the right upper extremity.  Objective: Vitals:   03/11/17 1733 03/11/17 2137 03/12/17 0634 03/12/17 1100  BP: (!) 115/59 130/67 117/60   Pulse: 90 93 83   Resp:  (!) 21 15   Temp:  98.7 F (37.1 C) 98 F (36.7 C)   TempSrc:  Oral Oral   SpO2:  95% 93% (!) 70%  Weight:   89.4 kg (197 lb 3.2 oz)   Height:        Intake/Output Summary (Last 24 hours) at 03/12/17 1419 Last data filed at 03/12/17 1400  Gross per 24 hour  Intake             1100 ml  Output             5250 ml  Net            -4150 ml   Filed Weights   03/10/17 0500 03/11/17 0534 03/12/17 0634  Weight:  95.8 kg (211 lb 4 oz) 95.8 kg (211 lb 3.2 oz) 89.4 kg (197 lb 3.2 oz)    Examination:  General exam: sitting in bed, resting comfortably Respiratory system: scattered fine crackles, good air movement, no accessory muscle use, few scattered expiratory wheezes Cardiovascular system: S1 & S2 heard, RRR. No JVD, murmurs, rubs, gallops or clicks. 3+ Bilateral lower extremity edema to mid thighs, edema of hands resolved Gastrointestinal system: Abdomen is obese, distended, soft and nontender. No organomegaly or masses  felt. Normal bowel sounds heard. Central nervous system: Alert and oriented. No focal neurological deficits. Extremities: able to move lower extremities independently, able to move L UE independently- PICC line present, R UE non weightbearing and minimal movement as ace wrap in place Skin: red blanchable rash of the proximal lower extremities and lower abdomen noted Psychiatry:  Appropriate mood and affect    Data Reviewed: I have personally reviewed following labs and imaging studies  CBC:  Recent Labs Lab 03/06/17 0441 03/08/17 0622 03/11/17 1450  WBC 5.5 5.8 6.9  NEUTROABS 3.5 3.8  --   HGB 12.2 12.4 12.8  HCT 42.4 44.0 44.5  MCV 88.3 88.0 87.8  PLT 135* 135* 179   Basic Metabolic Panel:  Recent Labs Lab 03/08/17 0622 03/09/17 0950 03/10/17 0736 03/11/17 0826 03/11/17 1450 03/12/17 0930  NA 142 140 141 139  --  140  K 3.7 4.0 4.3 4.4  --  4.1  CL 88* 86* 88* 88*  --  86*  CO2 46* 44* 45* 43*  --  49*  GLUCOSE 79 186* 89 193*  --  133*  BUN 19 28* 25* 28*  --  27*  CREATININE 0.89 0.96 0.92 0.92 1.14* 1.08*  CALCIUM 7.7* 7.8* 8.0* 8.1*  --  8.5*   GFR: Estimated Creatinine Clearance: 58.3 mL/min (A) (by C-G formula based on SCr of 1.08 mg/dL (H)). Liver Function Tests: No results for input(s): AST, ALT, ALKPHOS, BILITOT, PROT, ALBUMIN in the last 168 hours. No results for input(s): LIPASE, AMYLASE in the last 168 hours. No results for input(s): AMMONIA in the last 168 hours. Coagulation Profile: No results for input(s): INR, PROTIME in the last 168 hours. Cardiac Enzymes: No results for input(s): CKTOTAL, CKMB, CKMBINDEX, TROPONINI in the last 168 hours. BNP (last 3 results) No results for input(s): PROBNP in the last 8760 hours. HbA1C: No results for input(s): HGBA1C in the last 72 hours. CBG: No results for input(s): GLUCAP in the last 168 hours. Lipid Profile: No results for input(s): CHOL, HDL, LDLCALC, TRIG, CHOLHDL, LDLDIRECT in the last 72  hours. Thyroid Function Tests: No results for input(s): TSH, T4TOTAL, FREET4, T3FREE, THYROIDAB in the last 72 hours. Anemia Panel: No results for input(s): VITAMINB12, FOLATE, FERRITIN, TIBC, IRON, RETICCTPCT in the last 72 hours. Sepsis Labs: No results for input(s): PROCALCITON, LATICACIDVEN in the last 168 hours.  Recent Results (from the past 240 hour(s))  C difficile quick scan w PCR reflex     Status: None   Collection Time: 03/05/17  9:30 AM  Result Value Ref Range Status   C Diff antigen NEGATIVE NEGATIVE Final   C Diff toxin NEGATIVE NEGATIVE Final   C Diff interpretation No C. difficile detected.  Final         Radiology Studies: US Venous Img Upper Uni Right  Result Date: 03/12/2017 CLINICAL DATA:  Upper extremity pain and erythema EXAM: RIGHT UPPER EXTREMITY VENOUS DUPLEX ULTRASOUND TECHNIQUE: Gray-scale sonography with graded compression, as well as color  Doppler and duplex ultrasound were performed to evaluate the right upper extremity deep venous system from the level of the subclavian vein and including the jugular, axillary, basilic, radial, ulnar and upper cephalic vein. Spectral Doppler was utilized to evaluate flow at rest and with distal augmentation maneuvers. COMPARISON:  None. FINDINGS: Contralateral Jugular Vein: Respiratory phasicity is normal and symmetric with the symptomatic side. No evidence of thrombus. Normal compressibility. Internal Jugular Vein: No evidence of thrombus. Normal compressibility, respiratory phasicity and response to augmentation. Subclavian Vein: No evidence of thrombus. Normal compressibility, respiratory phasicity and response to augmentation. Axillary Vein: No evidence of thrombus. Normal compressibility, respiratory phasicity and response to augmentation. Cephalic Vein: No evidence of thrombus. Normal compressibility, respiratory phasicity and response to augmentation. Basilic Vein: No evidence of thrombus. Normal compressibility,  respiratory phasicity and response to augmentation. Brachial Veins: No evidence of thrombus. Normal compressibility, respiratory phasicity and response to augmentation. Radial Veins: No evidence of thrombus. Normal compressibility, respiratory phasicity and response to augmentation. Ulnar Veins: No evidence of thrombus. Normal compressibility, respiratory phasicity and response to augmentation. Venous Reflux:  None visualized. Other Findings:  None visualized. IMPRESSION: No evidence of right upper extremity deep venous thrombosis. Left jugular vein also patent. Electronically Signed   By: Bretta BangWilliam  Woodruff III M.D.   On: 03/12/2017 13:33        Scheduled Meds: . ALPRAZolam  0.5 mg Oral TID  . aspirin EC  81 mg Oral Daily  . Chlorhexidine Gluconate Cloth  6 each Topical Q0600  . enoxaparin (LOVENOX) injection  40 mg Subcutaneous Q24H  . [START ON 03/13/2017] furosemide  80 mg Oral Daily  . gabapentin  800 mg Oral QID  . potassium chloride  40 mEq Oral BID  . simvastatin  40 mg Oral QHS  . sodium chloride flush  3 mL Intravenous Q12H  . sodium chloride flush  3 mL Intravenous Q12H   Continuous Infusions:   LOS: 9 days    Time spent: 30 minutes    Katrinka BlazingAlex U Kadolph, MD [facility] Pager [phone]  If 7PM-7AM, please contact night-coverage [email or web address] Password [email or web address] 03/12/2017, 2:19 PM

## 2017-03-12 NOTE — Progress Notes (Signed)
Patient up to BR.  O2 off for approximately 3 minutes.  O2 sats dropped to 70% on RA.  Patient with mild SOB.  Placed on 4 L O2, sats increased to 93%

## 2017-03-13 DIAGNOSIS — I5031 Acute diastolic (congestive) heart failure: Secondary | ICD-10-CM | POA: Diagnosis present

## 2017-03-13 DIAGNOSIS — Z716 Tobacco abuse counseling: Secondary | ICD-10-CM

## 2017-03-13 MED ORDER — LISINOPRIL 5 MG PO TABS
5.0000 mg | ORAL_TABLET | Freq: Every day | ORAL | Status: DC
  Administered 2017-03-13: 5 mg via ORAL
  Filled 2017-03-13: qty 1

## 2017-03-13 MED ORDER — ASPIRIN 81 MG PO TBEC: 81.0000 mg | DELAYED_RELEASE_TABLET | Freq: Every day | ORAL | 0 refills | Status: AC

## 2017-03-13 MED ORDER — FUROSEMIDE 40 MG PO TABS: 40.0000 mg | ORAL_TABLET | Freq: Two times a day (BID) | ORAL | 0 refills | Status: AC

## 2017-03-13 MED ORDER — CARVEDILOL 3.125 MG PO TABS
3.1250 mg | ORAL_TABLET | Freq: Two times a day (BID) | ORAL | Status: DC
  Administered 2017-03-13: 3.125 mg via ORAL
  Filled 2017-03-13: qty 1

## 2017-03-13 MED ORDER — CARVEDILOL 3.125 MG PO TABS: 3.1250 mg | ORAL_TABLET | Freq: Two times a day (BID) | ORAL | 0 refills | Status: AC

## 2017-03-13 NOTE — Progress Notes (Signed)
Patient discharged home.  PICC removed-  WNL.  Instructed to leave pressure dressing on for 24 hours.  Reviewed Dc instructions and medications.  educated on HF management at home.  Emphasized importance of daily weight and low sodium diet to prevent HF exacerbation. Handouts given, verbalizes understanding using teach back.  Follow up appointments in place.  All questions answered.  Patients O2 set up per CM and in room, ready to go home.  Patient in NAD at this time.

## 2017-03-13 NOTE — Care Management Note (Signed)
Case Management Note  Patient Details  Name: Marie FritzBarbara T Counterman MRN: [id number] Date of Birth: 02/14/1956   Expected Discharge Date:  03/13/17               Expected Discharge Plan:  Home w Home Health Services  In-House Referral:  NA  Discharge planning Services  CM Consult  Post Acute Care Choice:  Home Health, Durable Medical Equipment, Resumption of Svcs/PTA Provider Choice offered to:  Patient  DME Arranged:  Oxygen DME Agency:  [facility]  HH Arranged[facility]  [facility] PT[facility]  Status of Service:  Completed, signed off  If discussed at Long Length of Stay Meetings, dates discussed: 03/13/2017   Additional Comments: Pt [facility] home today [facility] Pt aware HH has 48hrs to make first visit. Pt qualifies for supplemental oxygen at this time also, has chosen [facility] from list of DME providers. Alroy BailiffLinda Lothian, [facility] aware of [facility] and will obtain pt info from chart. Port tank delivered to pt room prior to DC. Pt has son or friend that will provide transport home.  Malcolm Metrohildress, Laterria Lasota Demske, RN 03/13/2017, 1:47 PM

## 2017-03-13 NOTE — Progress Notes (Signed)
Notified by primary team patient needs outpatient cardiology appointment for new onset diastolic HF. I have notified our office staff to arrange within 2 weeks   Dominga FerryJ Gayatri Teasdale MD

## 2017-03-13 NOTE — Care Management Important Message (Signed)
Important Message  Patient Details  Name: Marie FritzBarbara T Juarez MRN: [id number] Date of Birth: 01/06/1956   Medicare Important Message Given:  Yes    Malcolm MetroChildress, Jakeob Tullis Demske, RN 03/13/2017, 1:46 PM

## 2017-03-13 NOTE — Discharge Summary (Addendum)
Physician Discharge Summary  Mliss FritzBarbara T Freas ZOX:[id number] DOB: 07/13/1956 DOA: 03/03/2017  PCP: Pearson GrippeJames Kim, MD  Admit date: 03/03/2017 Discharge date: 03/13/2017  Admitted From: Home Disposition:  Home  Recommendations for Outpatient Follow-up:  1. Follow up with PCP in 1-2 weeks 2. outpt appointment with cardiology arranged.  Home Health: RN PT and nursing aide Equipment/Devices: O2 via nasal cannula  (3 L continuously on ambulation and bedtime)  Discharge Condition: Fair CODE STATUS: Full code Diet recommendation: Heart healthy    Discharge Diagnoses:  Principal Problem:   Acute respiratory failure with hypoxia and hypercapnia (HCC)   Active Problems:    Acute diastolic CHF (congestive heart failure) (HCC)   Anasarca   AKI (acute kidney injury) (HCC)   Thrombocytopenia (HCC)   Tobacco abuse counseling   Brief narrative/history of present illness Please refer to admission H&P for details, in brief, 61 year old female with history of hyperlipidemia, hypertension, ongoing tobacco use presented to the ED after home health nurse found her to be hypoxic. Patient was found to be in acute hypoxic and hypercapnic respiratory failure in the ED requiring BiPAP secondary to acute CHF exacerbation. Patient reported symptoms of lower extremity edema for past 2 months without improvement on diuretic therapy.  Hospital course Acute respiratory failure with hypoxia and hypercapnia (HCC) Secondary to acute diastolic CHF. Received aggressive diuresis for several days. She has been negative almost 29 L since admission. She seems to be at her baseline with today. 2-D echo showed EF of 65-70% with possibly normal diastolic function. Unable to document PA pressure. Follow-up chest x-ray showed improvement in pulmonary vascular congestion. Patient transitioned to by mouth Lasix. Will be discharged on 40 mg oral Lasix twice daily. I have added low-dose Coreg and baby aspirin. Continue  statin. Patient will need O2 via nasal cannula on ambulation.  Will arrange home health. Heart failure home health instructions provided.  Anasarca Secondary to acute diastolic CHF. Echo finding as above. No findings of cor pulmonale on echo. Appears euvolemic on exam. Treatment as above. I have discussed with cardiology and have arranged outpatient follow-up to establish care.  Mild thrombocytopenia Stable.  Troponin elevation Mild secondary to acute CHF. No EKG changes. No chest pain symptoms.  Recent right elbow fracture with joint infection Finished course of vancomycin. Follow-up with ID and orthopedics as outpatient.   Tobacco abuse Counseled strongly on cessation. Refused nicotine patch as it makes her very irritable.  Consults: None Family communication: None at bedside  procedure: 2-D echo   Discharge Instructions   Allergies as of 03/13/2017      Reactions   Keflex [cephalexin] Hives, Itching      Medication List    STOP taking these medications   vancomycin IVPB     TAKE these medications   ALPRAZolam 0.5 MG tablet Commonly known as:  XANAX Take 0.5 mg by mouth 3 (three) times daily.   amLODipine 5 MG tablet Commonly known as:  NORVASC Take 5 mg by mouth daily.   aspirin 81 MG EC tablet Take 1 tablet (81 mg total) by mouth daily. Start taking on:  03/14/2017   carvedilol 3.125 MG tablet Commonly known as:  COREG Take 1 tablet (3.125 mg total) by mouth 2 (two) times daily with a meal.   cholecalciferol 1000 units tablet Commonly known as:  VITAMIN D Take 1,000 Units by mouth daily.   furosemide 40 MG tablet Commonly known as:  LASIX Take 1 tablet (40 mg total) by mouth 2 (two) times  daily. What changed:  medication strength  how much to take  when to take this   gabapentin 800 MG tablet Commonly known as:  NEURONTIN Take 800 mg by mouth 4 (four) times daily.   HYDROcodone-acetaminophen 10-325 MG tablet Commonly known as:   NORCO Take 1 tablet by mouth 4 (four) times daily as needed for moderate pain.   nabumetone 750 MG tablet Commonly known as:  RELAFEN Take 750 mg by mouth 2 (two) times daily.   simvastatin 40 MG tablet Commonly known as:  ZOCOR Take 40 mg by mouth at bedtime.   vitamin C 1000 MG tablet Take 1,000 mg by mouth daily.            Durable Medical Equipment        Start     Ordered   03/12/17 1205  For home use only DME oxygen  Once    Question Answer Comment  Mode or (Route) Nasal cannula   Liters per Minute 4   Frequency Continuous (stationary and portable oxygen unit needed)   Oxygen conserving device Yes   Oxygen delivery system Gas      03/12/17 1204     Follow-up Information    [facility] Follow up.   Contact information: [address] [place] [address] [phone]        Prentice Docker, MD Follow up on 03/31/2017.   Specialty:  Cardiology Why:  at 8:40 am Sidney Ace) Contact information: [address] [place] [address] [phone]        Pearson Grippe, MD. Schedule an appointment as soon as possible for a visit in 1 week(s).   Specialty:  Internal Medicine Contact information: [address] [place] [address] [phone]          Allergies  Allergen Reactions  . Keflex [Cephalexin] Hives and Itching     Procedures/Studies: US Venous Img Upper Uni Right  Result Date: 03/12/2017 CLINICAL DATA:  Upper extremity pain and erythema EXAM: RIGHT UPPER EXTREMITY VENOUS DUPLEX ULTRASOUND TECHNIQUE: Gray-scale sonography with graded compression, as well as color Doppler and duplex ultrasound were performed to evaluate the right upper extremity deep venous system from the level of the subclavian vein and including the jugular, axillary, basilic, radial, ulnar and upper cephalic vein. Spectral Doppler was utilized to evaluate flow at rest and with distal augmentation maneuvers. COMPARISON:  None. FINDINGS:  Contralateral Jugular Vein: Respiratory phasicity is normal and symmetric with the symptomatic side. No evidence of thrombus. Normal compressibility. Internal Jugular Vein: No evidence of thrombus. Normal compressibility, respiratory phasicity and response to augmentation. Subclavian Vein: No evidence of thrombus. Normal compressibility, respiratory phasicity and response to augmentation. Axillary Vein: No evidence of thrombus. Normal compressibility, respiratory phasicity and response to augmentation. Cephalic Vein: No evidence of thrombus. Normal compressibility, respiratory phasicity and response to augmentation. Basilic Vein: No evidence of thrombus. Normal compressibility, respiratory phasicity and response to augmentation. Brachial Veins: No evidence of thrombus. Normal compressibility, respiratory phasicity and response to augmentation. Radial Veins: No evidence of thrombus. Normal compressibility, respiratory phasicity and response to augmentation. Ulnar Veins: No evidence of thrombus. Normal compressibility, respiratory phasicity and response to augmentation. Venous Reflux:  None visualized. Other Findings:  None visualized. IMPRESSION: No evidence of right upper extremity deep venous thrombosis. Left jugular vein also patent. Electronically Signed   By: Bretta Bang III M.D.   On: 03/12/2017 13:33   Dg Chest Port 1 View  Result Date: 03/09/2017 CLINICAL DATA:  61 year old female with lower extremity swelling, shortness of breath. EXAM: PORTABLE CHEST 1 VIEW COMPARISON:  Portable chest 03/03/2017 and earlier. FINDINGS: Portable AP upright view at 1209 hours. Stable left PICC line. Chronic elevation of the right hemidiaphragm suspected, but there is probably a superimposed small right pleural effusion. There is trace pleural fluid in the minor fissure. There is streaky medial bilateral lung base opacity, and curvilinear left mid lung opacity. No left pleural effusion. Bilateral pulmonary vascular  congestion has mildly decreased but not resolved. Stable cardiomegaly and mediastinal contours. Visualized tracheal air column is within normal limits. No pneumothorax. Negative visible bowel gas pattern. IMPRESSION: 1. Decreased but not resolved pulmonary vascular congestion/interstitial edema. 2. Small right pleural effusion suspected, superimposed on right hemidiaphragm elevation. 3. Nonspecific streaky medial bilateral lung base opacity. Favor lower lobe atelectasis, in conjunction with mild left perihilar atelectasis. Electronically Signed   By: Odessa Fleming M.D.   On: 03/09/2017 14:24   Dg Chest Portable 1 View  Result Date: 03/03/2017 CLINICAL DATA:  Increase shortness of breath and hypoxia. EXAM: PORTABLE CHEST 1 VIEW COMPARISON:  None. FINDINGS: The heart is enlarged. A diffuse interstitial pattern is present. The right hemidiaphragm is elevated. Bibasilar airspace disease is worse on the right, likely reflecting atelectasis. The visualized soft tissues and bony thorax are unremarkable. IMPRESSION: 1. Cardiomegaly with a diffuse interstitial pattern suggesting congestive heart failure. 2. Mild bibasilar airspace disease is worse on the right. While this likely reflects atelectasis, infection is not excluded. Electronically Signed   By: Marin Roberts M.D.   On: 03/03/2017 11:42    2-D echo Left ventricle:  The cavity size was normal. Wall thickness was increased in a pattern of mild LVH. Systolic function was vigorous. The estimated ejection fraction was in the range of 65% to 70%. Wall motion was normal; there were no regional wall motion abnormalities. Left ventricular diastolic function parameters were normal.  Subjective: Feels much better today.  Discharge Exam: Vitals:   03/12/17 2102 03/13/17 0410  BP: 128/78 (!) 144/70  Pulse: 77 86  Resp: 18 18  Temp: 97.8 F (36.6 C) 98.3 F (36.8 C)   Vitals:   03/12/17 1100 03/12/17 1503 03/12/17 2102 03/13/17 0410  BP:  133/68  128/78 (!) 144/70  Pulse:  79 77 86  Resp:  16 18 18   Temp:  98.2 F (36.8 C) 97.8 F (36.6 C) 98.3 F (36.8 C)  TempSrc:   Oral Oral  SpO2: (!) 70% 94% 94% 93%  Weight:    88 kg (193 lb 14.4 oz)  Height:        General: Middle aged female not in distress HEENT: Mucosa, supple neck, no JVD Chest: Fine bibasilar crackles, no rhonchi or wheeze Cardiovascular: RRR, S1/S2 +, no murmurs, no rubs, no gallops GI: Soft, nondistended, nontender Musculoskeletal: Warm, no edema    The results of significant diagnostics from this hospitalization (including imaging, microbiology, ancillary and laboratory) are listed below for reference.     Microbiology: Recent Results (from the past 240 hour(s))  C difficile quick scan w PCR reflex     Status: None   Collection Time: 03/05/17  9:30 AM  Result Value Ref Range Status   C Diff antigen NEGATIVE NEGATIVE Final   C Diff toxin NEGATIVE NEGATIVE Final   C Diff interpretation No C. difficile detected.  Final     Labs: BNP (last 3 results)  Recent Labs  03/03/17 1133  BNP 100.0   Basic Metabolic Panel:  Recent Labs Lab 03/08/17 0622 03/09/17 0950 03/10/17 0736 03/11/17 0826 03/11/17 1450 03/12/17 0930  NA 142 140 141 139  --  140  K 3.7 4.0 4.3 4.4  --  4.1  CL 88* 86* 88* 88*  --  86*  CO2 46* 44* 45* 43*  --  49*  GLUCOSE 79 186* 89 193*  --  133*  BUN 19 28* 25* 28*  --  27*  CREATININE 0.89 0.96 0.92 0.92 1.14* 1.08*  CALCIUM 7.7* 7.8* 8.0* 8.1*  --  8.5*   Liver Function Tests: No results for input(s): AST, ALT, ALKPHOS, BILITOT, PROT, ALBUMIN in the last 168 hours. No results for input(s): LIPASE, AMYLASE in the last 168 hours. No results for input(s): AMMONIA in the last 168 hours. CBC:  Recent Labs Lab 03/08/17 0622 03/11/17 1450  WBC 5.8 6.9  NEUTROABS 3.8  --   HGB 12.4 12.8  HCT 44.0 44.5  MCV 88.0 87.8  PLT 135* 179   Cardiac Enzymes: No results for input(s): CKTOTAL, CKMB, CKMBINDEX, TROPONINI in  the last 168 hours. BNP: Invalid input(s): POCBNP CBG: No results for input(s): GLUCAP in the last 168 hours. D-Dimer No results for input(s): DDIMER in the last 72 hours. Hgb A1c No results for input(s): HGBA1C in the last 72 hours. Lipid Profile No results for input(s): CHOL, HDL, LDLCALC, TRIG, CHOLHDL, LDLDIRECT in the last 72 hours. Thyroid function studies No results for input(s): TSH, T4TOTAL, T3FREE, THYROIDAB in the last 72 hours.  Invalid input(s): FREET3 Anemia work up No results for input(s): VITAMINB12, FOLATE, FERRITIN, TIBC, IRON, RETICCTPCT in the last 72 hours. Urinalysis    Component Value Date/Time   COLORURINE AMBER (A) 03/03/2017 1450   APPEARANCEUR CLOUDY (A) 03/03/2017 1450   LABSPEC 1.019 03/03/2017 1450   PHURINE 5.0 03/03/2017 1450   GLUCOSEU NEGATIVE 03/03/2017 1450   HGBUR NEGATIVE 03/03/2017 1450   BILIRUBINUR NEGATIVE 03/03/2017 1450   KETONESUR NEGATIVE 03/03/2017 1450   PROTEINUR 30 (A) 03/03/2017 1450   NITRITE NEGATIVE 03/03/2017 1450   LEUKOCYTESUR NEGATIVE 03/03/2017 1450   Sepsis Labs Invalid input(s): PROCALCITONIN,  WBC,  LACTICIDVEN Microbiology Recent Results (from the past 240 hour(s))  C difficile quick scan w PCR reflex     Status: None   Collection Time: 03/05/17  9:30 AM  Result Value Ref Range Status   C Diff antigen NEGATIVE NEGATIVE Final   C Diff toxin NEGATIVE NEGATIVE Final   C Diff interpretation No C. difficile detected.  Final     Time coordinating discharge: Over 30 minutes  SIGNED:   Eddie North, MD  [facility] 03/13/2017, 10:45 AM Pager   If 7PM-7AM, please contact night-coverage [email or web address] Password [email or web address]

## 2017-03-13 NOTE — Progress Notes (Signed)
SATURATION QUALIFICATIONS: (This note is used to comply with regulatory documentation for home oxygen)  Patient Saturations on Room Air at Rest = 84%  Patient Saturations on Room Air while Ambulating = 80%  Patient Saturations on 3 Liters of oxygen while Ambulating = 92%  Please briefly explain why patient needs home oxygen: sats 80% without any O2 while ambulating, when 2L O2 applied while ambulating, sats increased to 87%, increased to 3L and sats up to 92%.

## 2017-03-18 ENCOUNTER — Ambulatory Visit: Payer: Medicare Other | Admitting: Internal Medicine

## 2017-03-18 ENCOUNTER — Telehealth: Payer: Self-pay | Admitting: Cardiovascular Disease

## 2017-03-18 NOTE — Telephone Encounter (Signed)
HHN Byrd Hesselbach called back to say she is now unsure of patients weight. She said it was dark in the room and she may have been wrong.Her ankle circumference is actually down 2 cm.She says she will check again next week and call back

## 2017-03-18 NOTE — Telephone Encounter (Signed)
Please call Marie Juarez w/ [facility] [phone], pt has gained weight, Friday 188lb and today she's 194lb. She's got a level 1 med interaction on her chart w/ amlodipine and simvastatin

## 2017-03-18 NOTE — Telephone Encounter (Signed)
Sent to DOD, Dr Eden Emms as Dr Purvis Sheffield is out of office

## 2017-03-24 ENCOUNTER — Ambulatory Visit (INDEPENDENT_AMBULATORY_CARE_PROVIDER_SITE_OTHER): Payer: Medicare Other | Admitting: Internal Medicine

## 2017-03-24 ENCOUNTER — Encounter: Payer: Self-pay | Admitting: Internal Medicine

## 2017-03-24 DIAGNOSIS — T814XXD Infection following a procedure, subsequent encounter: Secondary | ICD-10-CM

## 2017-03-24 DIAGNOSIS — Z5181 Encounter for therapeutic drug level monitoring: Secondary | ICD-10-CM | POA: Diagnosis not present

## 2017-03-24 DIAGNOSIS — IMO0001 Reserved for inherently not codable concepts without codable children: Secondary | ICD-10-CM

## 2017-03-24 NOTE — Assessment & Plan Note (Signed)
Creat did increase a bit but now off of vancomycin.  Last creat 1.08.  Stable at this time.

## 2017-03-24 NOTE — Progress Notes (Signed)
   Subjective:    Patient ID: Marie Juarez, female    DOB: Nov 13, 1956, 61 y.o.   MRN: [id number]  HPI Here for hsfu.  Had a right elbow fracture with disclocation in December 2017 and repaired with prosthetic implant but then fell and developed cellulitis and purulence and sent in to the hospital for I and D.  In February underwent I and D and hardware removal but prosthetic material not felt to be involved.  She was placed on vancomycin and culture did grow out methicillin sensitive Staph epi and she completed vancomycin for 4 weeks.  She is now about 2 weeks out and doing well.  Having more movement in her elbow, no fever, no chills.  No associated diarrhea.     Review of Systems  Gastrointestinal: Negative for diarrhea and nausea.  Skin: Negative for rash.       Objective:   Physical Exam  Constitutional: She appears well-developed and well-nourished.  On O2  Eyes: No scleral icterus.  Cardiovascular: Normal rate, regular rhythm and normal heart sounds.   Musculoskeletal:  Elbow with healed incision, no warmth, no erythema, non tender  Skin: No rash noted.          Assessment & Plan:

## 2017-03-24 NOTE — Assessment & Plan Note (Signed)
Doing well now off of antibiotics.  No further treatment indicated at this time.  Advised on signs of relapse infection including, but not limited to, swelling, pain warmth or erythema.   rtc prn

## 2017-03-31 ENCOUNTER — Encounter: Payer: Self-pay | Admitting: Cardiovascular Disease

## 2017-03-31 ENCOUNTER — Ambulatory Visit (INDEPENDENT_AMBULATORY_CARE_PROVIDER_SITE_OTHER): Payer: Medicare Other | Admitting: Cardiovascular Disease

## 2017-03-31 VITALS — BP 138/86 | HR 90 | Ht 62.0 in | Wt 190.0 lb

## 2017-03-31 DIAGNOSIS — I5032 Chronic diastolic (congestive) heart failure: Secondary | ICD-10-CM | POA: Diagnosis not present

## 2017-03-31 DIAGNOSIS — Z9289 Personal history of other medical treatment: Secondary | ICD-10-CM

## 2017-03-31 DIAGNOSIS — I10 Essential (primary) hypertension: Secondary | ICD-10-CM | POA: Diagnosis not present

## 2017-03-31 DIAGNOSIS — E782 Mixed hyperlipidemia: Secondary | ICD-10-CM | POA: Diagnosis not present

## 2017-03-31 DIAGNOSIS — Z720 Tobacco use: Secondary | ICD-10-CM

## 2017-03-31 DIAGNOSIS — J449 Chronic obstructive pulmonary disease, unspecified: Secondary | ICD-10-CM | POA: Diagnosis not present

## 2017-03-31 NOTE — Patient Instructions (Signed)
Medication Instructions:  Your physician recommends that you continue on your current medications as directed. Please refer to the Current Medication list given to you today.   Labwork: none  Testing/Procedures: none  Follow-Up: Your physician recommends that you schedule a follow-up appointment in: 2 months    Any Other Special Instructions Will Be Listed Below (If Applicable).  You have been referred to Pulmonology for COPD    If you need a refill on your cardiac medications before your next appointment, please call your pharmacy.

## 2017-03-31 NOTE — Progress Notes (Signed)
CARDIOLOGY CONSULT NOTE  Patient ID: Marie Juarez MRN: [id number] DOB/AGE: [birth year] 61 y.o.  Admit date: (Not on file) Primary Physician: Pearson Grippe, MD Referring Physician: Selena Batten  Reason for Consultation: new diastolic heart failure  HPI: Marie Juarez is a 61 y.o. female who is being seen today for the evaluation of new diastolic heart failure at the request of Pearson Grippe, MD.   She has a history of hypertension and hyperlipidemia and was admitted for acute hypoxic and hypercapnic restaurant failure in March 2018 secondary to new onset diastolic heart failure. She diuresed 29 L. I personally reviewed all relevant documentation, labs, and studies.  Echocardiogram demonstrated vigorous left ventricular systolic function, LVEF 65-70%. She had a mild troponin elevation secondary to CHF. Interestingly, I reviewed the echocardiogram report dated 03/04/17 which demonstrated "grossly normal diastolic function".  With regards to CHF, she says her breathing has been stable. She uses 3 L of oxygen and was discharged on this. She says she has never been tested for COPD. She has smoked a pack of cigarettes daily for over 35 years.  She denies chest pain.  She has chronic leg swelling but says this is stable.  Her primary complaints relate to lower back pain due to a bulging disc with pain in both legs and feet, left worse than right.  She says that the pain medications cause lightheadedness and dizziness.  Allergies  Allergen Reactions  . Keflex [Cephalexin] Hives and Itching    Current Outpatient Prescriptions  Medication Sig Dispense Refill  . ALPRAZolam (XANAX) 0.5 MG tablet Take 0.5 mg by mouth 3 (three) times daily.    Marland Kitchen amLODipine (NORVASC) 5 MG tablet Take 5 mg by mouth daily.    . Ascorbic Acid (VITAMIN C) 1000 MG tablet Take 1,000 mg by mouth daily.    Marland Kitchen aspirin EC 81 MG EC tablet Take 1 tablet (81 mg total) by mouth daily. 30 tablet 0  . carvedilol (COREG) 3.125 MG  tablet Take 1 tablet (3.125 mg total) by mouth 2 (two) times daily with a meal. 60 tablet 0  . cholecalciferol (VITAMIN D) 1000 units tablet Take 1,000 Units by mouth daily.    . furosemide (LASIX) 40 MG tablet Take 1 tablet (40 mg total) by mouth 2 (two) times daily. 60 tablet 0  . gabapentin (NEURONTIN) 800 MG tablet Take 800 mg by mouth 4 (four) times daily.    Marland Kitchen HYDROcodone-acetaminophen (NORCO) 10-325 MG tablet Take 1 tablet by mouth 4 (four) times daily as needed for moderate pain.    . nabumetone (RELAFEN) 750 MG tablet Take 750 mg by mouth 2 (two) times daily.    . simvastatin (ZOCOR) 40 MG tablet Take 40 mg by mouth at bedtime.      No current facility-administered medications for this visit.     Past Medical History:  Diagnosis Date  . Anxiety   . Arthritis    "knees, hips" (02/06/2017)  . Bulging lumbar disc   . Chronic lower back pain   . Edema   . Fracture dislocation of right elbow joint   . GERD (gastroesophageal reflux disease)   . Hyperlipidemia   . Hypertension   . 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 Shortness of breath    with exertion    Past Surgical History:  Procedure Laterality Date  . CESAREAN SECTION  1989  . FRACTURE SURGERY    . HEEL SPUR SURGERY Left 2002  .  I&D EXTREMITY Right 02/06/2017   Procedure: IRRIGATION AND DEBRIDEMENT ELBOW and HARDWARE REMOVAL;  Surgeon: Mack Hook, MD;  Location: [facility] OR;  Service: Orthopedics;  Laterality: Right;  . ORIF ELBOW FRACTURE Right 12/19/2016   Procedure: OPEN TREATMENT OF RIGHT ELBOW-DISLOCATION FRACTURE;  Surgeon: Mack Hook, MD;  Location: [facility] OR;  Service: Orthopedics;  Laterality: Right;  GENERAL ANESTHESIA WITH PRE-OP BLOCK  . TARSAL TUNNEL RELEASE Left 2003; 2004    Social History   Social History  . Marital status: Widowed    Spouse name: N/A  . Number of children: N/A  . Years of education: N/A   Occupational History  . Not on file.   Social History Main Topics  . Smoking status:  Current Every Day Smoker    Packs/day: 0.75    Years: 37.00    Types: Cigarettes  . Smokeless tobacco: Never Used  . Alcohol use No  . Drug use: No  . Sexual activity: Not Currently    Birth control/ protection: Post-menopausal   Other Topics Concern  . Not on file   Social History Narrative  . No narrative on file     No family history of premature CAD in 1st degree relatives.  Current Meds  Medication Sig  . ALPRAZolam (XANAX) 0.5 MG tablet Take 0.5 mg by mouth 3 (three) times daily.  Marland Kitchen amLODipine (NORVASC) 5 MG tablet Take 5 mg by mouth daily.  . Ascorbic Acid (VITAMIN C) 1000 MG tablet Take 1,000 mg by mouth daily.  Marland Kitchen aspirin EC 81 MG EC tablet Take 1 tablet (81 mg total) by mouth daily.  . carvedilol (COREG) 3.125 MG tablet Take 1 tablet (3.125 mg total) by mouth 2 (two) times daily with a meal.  . cholecalciferol (VITAMIN D) 1000 units tablet Take 1,000 Units by mouth daily.  . furosemide (LASIX) 40 MG tablet Take 1 tablet (40 mg total) by mouth 2 (two) times daily.  Marland Kitchen gabapentin (NEURONTIN) 800 MG tablet Take 800 mg by mouth 4 (four) times daily.  Marland Kitchen HYDROcodone-acetaminophen (NORCO) 10-325 MG tablet Take 1 tablet by mouth 4 (four) times daily as needed for moderate pain.  . nabumetone (RELAFEN) 750 MG tablet Take 750 mg by mouth 2 (two) times daily.  . simvastatin (ZOCOR) 40 MG tablet Take 40 mg by mouth at bedtime.       Review of systems complete and found to be negative unless listed above in HPI    Physical exam Blood pressure 138/86, pulse 90, height  (1.575 m), weight 190 lb (86.2 kg), SpO2 92 %. General: NAD Neck: No JVD, no thyromegaly or thyroid nodule.  Lungs: Diminished throughout, no rales or wheezes. CV: Nondisplaced PMI. Regular rate and rhythm, normal S1/S2, no S3/S4, no murmur.  1+ nonpitting pretibial edema b/l.  No carotid bruit.    Abdomen: Soft, nontender, no distention.  Skin: Intact without lesions or rashes.  Neurologic: Alert and  oriented x 3.  Psych: Normal affect. Extremities: No cyanosis.  HEENT: Normal.   ECG: Most recent ECG reviewed.   Labs: Lab Results  Component Value Date/Time   K 4.1 03/12/2017 09:30 AM   BUN 27 (H) 03/12/2017 09:30 AM   CREATININE 1.08 (H) 03/12/2017 09:30 AM   ALT 12 (L) 03/03/2017 11:33 AM   HGB 12.8 03/11/2017 02:50 PM     Lipids: No results found for: LDLCALC, LDLDIRECT, CHOL, TRIG, HDL      ASSESSMENT AND PLAN:  1. New onset diastolic heart failure: As stated  above, diastolic function was grossly normal by echocardiogram in March. She appears relatively euvolemic on Lasix 40 mg twice daily. I encouraged her to avoid salt and to keep blood pressure controlled.  2. Hypertension: Controlled on present therapy. No changes.  3. Hyperlipidemia: Continue statin.  4. Tobacco abuse with diminished lung sounds: She very likely has COPD due to several years of tobacco abuse. I will make a pulmonary referral.  Disposition: Follow up in 6 months  Signed: Prentice Docker, M.D., F.A.C.C.  03/31/2017, 8:59 AM

## 2017-04-08 ENCOUNTER — Encounter: Payer: Self-pay | Admitting: Cardiovascular Disease

## 2017-04-10 DIAGNOSIS — I110 Hypertensive heart disease with heart failure: Secondary | ICD-10-CM | POA: Diagnosis not present

## 2017-04-14 ENCOUNTER — Institutional Professional Consult (permissible substitution): Payer: Medicare Other | Admitting: Pulmonary Disease

## 2017-04-17 ENCOUNTER — Ambulatory Visit (INDEPENDENT_AMBULATORY_CARE_PROVIDER_SITE_OTHER): Payer: Medicare Other | Admitting: Pulmonary Disease

## 2017-04-17 ENCOUNTER — Encounter: Payer: Self-pay | Admitting: Pulmonary Disease

## 2017-04-17 ENCOUNTER — Ambulatory Visit (INDEPENDENT_AMBULATORY_CARE_PROVIDER_SITE_OTHER)
Admission: RE | Admit: 2017-04-17 | Discharge: 2017-04-17 | Disposition: A | Discharge status: Home / Self Care | Payer: Medicare Other | Source: Ambulatory Visit | Attending: Pulmonary Disease | Admitting: Pulmonary Disease

## 2017-04-17 VITALS — BP 128/70 | HR 86 | Ht 62.0 in | Wt 183.4 lb

## 2017-04-17 DIAGNOSIS — R0600 Dyspnea, unspecified: Secondary | ICD-10-CM | POA: Diagnosis not present

## 2017-04-17 LAB — NITRIC OXIDE: Nitric Oxide: <5

## 2017-04-17 MED ORDER — BUDESONIDE-FORMOTEROL FUMARATE 160-4.5 MCG/ACT IN AERO: 2.0000 | INHALATION_SPRAY | Freq: Two times a day (BID) | RESPIRATORY_TRACT | 6 refills | Status: DC

## 2017-04-17 MED ORDER — BUDESONIDE-FORMOTEROL FUMARATE 160-4.5 MCG/ACT IN AERO: 2.0000 | INHALATION_SPRAY | Freq: Two times a day (BID) | RESPIRATORY_TRACT | 0 refills | Status: AC

## 2017-04-17 NOTE — Patient Instructions (Addendum)
We will get a chest x-ray today and check oxygen levels with exercise, FENO We will start on Symbicort 160/4.5. Schedule PFTs before next visit Referral for low-dose screening CT of the chest  Work on smoking cessation  Return on 1-2 months.

## 2017-04-17 NOTE — Progress Notes (Signed)
Marie FritzBarbara T Juarez    [id number]    03/25/1956  Primary Care Physician:James Marie BattenKim, MD  Referring Physician: Pearson GrippeJames Kim, MD [address] [place], [place] [address]  Chief complaint:  Consult for evaluation of dyspnea  HPI: Mrs. Marie Juarez is a 61 year old with past medical history of CHF, hypertension, hyperlipidemia, GERD she was [facility] to [facility] and March 2018 with acute respiratory failure with hypoxia, hypercapnia. She was found to have acute diastolic heart failure and received aggressive diuresis with improvement in chest x-ray and symptoms. She is discharged on Lasix, Coreg, aspirin, statin and supplemental oxygen. She's had a recent right elbow fracture joint infection. She's finished course of vancomycin and is followed up with ID and orthopedics.  Since her discharge he reports stable symptoms with chronic dyspnea on exertion. She has daily productive cough with brown sputum production. Denies any wheezing, hemoptysis. She is not using her oxygen on a regular basis and wants to come off it completely. She has an extensive smoking history and continues to smoke up to a pack a day. She is trying to cut down on her tobacco use but declines any additional help for this. She is not on any inhalers at present.   Outpatient Encounter Prescriptions as of 04/17/2017  Medication Sig  . ALPRAZolam (XANAX) 0.5 MG tablet Take 0.5 mg by mouth 3 (three) times daily.  Marland Kitchen. amLODipine (NORVASC) 5 MG tablet Take 5 mg by mouth daily.  . Ascorbic Acid (VITAMIN C) 1000 MG tablet Take 1,000 mg by mouth daily.  Marland Kitchen. aspirin EC 81 MG EC tablet Take 1 tablet (81 mg total) by mouth daily.  . carvedilol (COREG) 3.125 MG tablet Take 1 tablet (3.125 mg total) by mouth 2 (two) times daily with a meal.  . cholecalciferol (VITAMIN D) 1000 units tablet Take 1,000 Units by mouth daily.  . furosemide (LASIX) 40 MG tablet Take 1 tablet (40 mg total) by mouth 2 (two) times daily.  Marland Kitchen. gabapentin (NEURONTIN) 800  MG tablet Take 800 mg by mouth 4 (four) times daily.  Marland Kitchen. HYDROcodone-acetaminophen (NORCO) 10-325 MG tablet Take 1 tablet by mouth 4 (four) times daily as needed for moderate pain.  . nabumetone (RELAFEN) 750 MG tablet Take 750 mg by mouth 2 (two) times daily.  . simvastatin (ZOCOR) 40 MG tablet Take 40 mg by mouth at bedtime.    No facility-administered encounter medications on file as of 04/17/2017.     Allergies as of 04/17/2017 - Review Complete 04/17/2017  Allergen Reaction Noted  . Keflex [cephalexin] Hives and Itching 08/24/2015    Past Medical History:  Diagnosis Date  . Anxiety   . Arthritis    "knees, hips" (02/06/2017)  . Bulging lumbar disc   . CHF (congestive heart failure) (HCC)   . Chronic lower back pain   . Edema   . Fracture dislocation of right elbow joint   . GERD (gastroesophageal reflux disease)   . Hyperlipidemia   . Hypertension   . 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 Shortness of breath    with exertion    Past Surgical History:  Procedure Laterality Date  . CESAREAN SECTION  1989  . FRACTURE SURGERY    . HEEL SPUR SURGERY Left 2002  . I&D EXTREMITY Right 02/06/2017   Procedure: IRRIGATION AND DEBRIDEMENT ELBOW and HARDWARE REMOVAL;  Surgeon: Mack Hookavid Thompson, MD;  [facility];  Service: Orthopedics;  Laterality: Right;  .  ORIF ELBOW FRACTURE Right 12/19/2016   Procedure: OPEN TREATMENT OF RIGHT ELBOW-DISLOCATION FRACTURE;  Surgeon: Mack Hook, MD;  Location: [facility] OR;  Service: Orthopedics;  Laterality: Right;  GENERAL ANESTHESIA WITH PRE-OP BLOCK  . TARSAL TUNNEL RELEASE Left 2003; 2004    Family History  Problem Relation Age of Onset  . Diabetes Mother   . Hypertension Mother   . Stroke Father   . Diabetes Sister   . Hypertension Sister   . Diabetes Brother     borderline  . Diabetes Maternal Grandmother   . Hypertension Maternal Grandmother   . Stroke Maternal Grandfather   . Diabetes Brother   . Diabetes Brother     Social History     Social History  . Marital status: Widowed    Spouse name: N/A  . Number of children: N/A  . Years of education: N/A   Occupational History  .      Disabled   Social History Main Topics  . Smoking status: Current Every Day Smoker    Packs/day: 0.75    Years: 37.00    Types: Cigarettes  . Smokeless tobacco: Never Used  . Alcohol use No  . Drug use: No  . Sexual activity: Not Currently    Birth control/ protection: Post-menopausal   Other Topics Concern  . Not on file   Social History Narrative   Lives with son.   Widowed.    Review of systems: Review of Systems  Constitutional: Negative for fever and chills.  HENT: Negative.   Eyes: Negative for blurred vision.  Respiratory: as per HPI  Cardiovascular: Negative for chest pain and palpitations.  Gastrointestinal: Negative for vomiting, diarrhea, blood per rectum. Genitourinary: Negative for dysuria, urgency, frequency and hematuria.  Musculoskeletal: Negative for myalgias, back pain and joint pain.  Skin: Negative for itching and rash.  Neurological: Negative for dizziness, tremors, focal weakness, seizures and loss of consciousness.  Endo/Heme/Allergies: Negative for environmental allergies.  Psychiatric/Behavioral: Negative for depression, suicidal ideas and hallucinations.  All other systems reviewed and are negative.  Physical Exam: Blood pressure 128/70, pulse 86, height 5\' 2"  (1.575 m), weight 183 lb 6.4 oz (83.2 kg), SpO2 97 %. Gen:      No acute distress HEENT:  EOMI, sclera anicteric Neck:     No masses; no thyromegaly Lungs:    Clear to auscultation bilaterally; normal respiratory effort CV:         Regular rate and rhythm; no murmurs Abd:      + bowel sounds; soft, non-tender; no palpable masses, no distension Ext:    No edema; adequate peripheral perfusion Skin:      Warm and dry; no rash Neuro: alert and oriented x 3 Psych: normal mood and affect  Data Reviewed: CBC    Component Value  Date/Time   WBC 6.9 03/11/2017 1450   RBC 5.07 03/11/2017 1450   HGB 12.8 03/11/2017 1450   HCT 44.5 03/11/2017 1450   PLT 179 03/11/2017 1450   MCV 87.8 03/11/2017 1450   MCH 25.2 (L) 03/11/2017 1450   MCHC 28.8 (L) 03/11/2017 1450   RDW 17.6 (H) 03/11/2017 1450   LYMPHSABS 1.2 03/08/2017 0622   MONOABS 0.5 03/08/2017 0622   EOSABS 0.3 03/08/2017 0622   BASOSABS 0.0 03/08/2017 0622   FENO 04/17/17- <5  Chest x-ray 03/03/17- cardiomegaly, pulmonary edema, right hemidiaphragm elevation Chest x-ray 03/09/17- improvement in pulmonary edema, persistent right hemidiaphragm elevation  Echocardiogram 03/04/17 - Mild LVH with LVEF 65-70%. Grossly normal  diastolic function.   Mild calcified mitral annulus with trivial mitral regurgitation.   Mildly calcified aortic annulus. Trivial tricuspid regurgitation.   Evidence of elevated CVP.  Assessment:  Evaluation for respiratory failure, dyspnea She likely has COPD based on a smoking history. I reviewed her x-rays which show CHF and also right hemidiaphragm elevation of unclear significance and etiology. We will repeat a chest x-ray today, get pulmonary function test Noted to have slight elevation in peripheral eosinophils in March. She may benefit from combined inhaler with steroids+LABA. Check FENO and start Symbicort 160/4.5.  Active smoker Not interested in quitting at present. Referral for screening CT of the chest  Plan/Recommendations: - Check PFTs, chest x-ray, FENO - Start Symbicort - Referral for low-dose screening CT of the chest  Chilton Greathouse MD [facility] Pager [phone] 04/17/2017, 12:16 PM  CC: Marie Grippe, MD

## 2017-04-25 ENCOUNTER — Telehealth: Payer: Self-pay | Admitting: Pulmonary Disease

## 2017-04-25 NOTE — Telephone Encounter (Signed)
Call[facility] and [facility]  She is aware of order per PM and she has already placed the order for this to be set up.  Nothing furhter is needed.

## 2017-04-25 NOTE — Telephone Encounter (Signed)
Marie Ju[facility] with [facility] called and state that the pt is not able to fill the symbicort due to the cost and she cannot afford the out of pocket expense.  They are requesting an order for a medical social worker to come in and help people.  PM please advise. thanks

## 2017-04-25 NOTE — Telephone Encounter (Signed)
Ok to place the order.   Marie Juarez

## 2017-05-01 ENCOUNTER — Ambulatory Visit (INDEPENDENT_AMBULATORY_CARE_PROVIDER_SITE_OTHER): Payer: Medicare Other | Admitting: Pulmonary Disease

## 2017-05-01 DIAGNOSIS — R0600 Dyspnea, unspecified: Secondary | ICD-10-CM | POA: Diagnosis not present

## 2017-05-02 ENCOUNTER — Telehealth: Payer: Self-pay

## 2017-05-02 NOTE — Telephone Encounter (Signed)
Marie Juarez called stating pt will not elevate legs and since last week her lower leg is up 3 cm. She is taking 40 mg of lasix BID and her weight is staying down. She has no complaints of sob. She refuses to wear TED hose, so nurse asked for verbal order for the soft ace bandages to help support her legs. I spoke with Dr. Purvis SheffieldKoneswaran and he stated it was fine.

## 2017-05-02 NOTE — Progress Notes (Signed)
PFT completed 05/01/17.

## 2017-05-15 LAB — PULMONARY FUNCTION TEST
DL/VA % PRED: 96 %
DL/VA: 4.33 ml/min/mmHg/L
DLCO UNC: 12.62 ml/min/mmHg
DLCO unc % pred: 60 %
FEF 25-75 Post: 1.65 L/sec
FEF 25-75 Pre: 0.78 L/sec
FEF2575-%Change-Post: 111 %
FEF2575-%PRED-PRE: 35 %
FEF2575-%Pred-Post: 74 %
FEV1-%Change-Post: 21 %
FEV1-%Pred-Post: 55 %
FEV1-%Pred-Pre: 45 %
FEV1-PRE: 1.06 L
FEV1-Post: 1.29 L
FEV1FVC-%CHANGE-POST: 6 %
FEV1FVC-%Pred-Pre: 96 %
FEV6-%Change-Post: 13 %
FEV6-%PRED-PRE: 48 %
FEV6-%Pred-Post: 55 %
FEV6-Post: 1.61 L
FEV6-Pre: 1.41 L
FEV6FVC-%Pred-Post: 104 %
FEV6FVC-%Pred-Pre: 104 %
FVC-%Change-Post: 13 %
FVC-%Pred-Post: 53 %
FVC-%Pred-Pre: 46 %
FVC-Post: 1.61 L
FVC-Pre: 1.41 L
POST FEV6/FVC RATIO: 100 %
PRE FEV1/FVC RATIO: 75 %
Post FEV1/FVC ratio: 80 %
Pre FEV6/FVC Ratio: 100 %
RV % pred: 140 %
RV: 2.6 L
TLC % PRED: 89 %
TLC: 4.18 L

## 2017-05-19 ENCOUNTER — Ambulatory Visit: Payer: Medicare Other | Admitting: Pulmonary Disease

## 2017-06-15 NOTE — Addendum Note (Signed)
Addendum  created 06/13/2017 0949 by Minta Fair, MD   Sign clinical note    

## 2017-06-15 DEATH — deceased

## 2017-06-23 IMAGING — DX DG CHEST 2V
2 series · 2 of 2 positions shown · non-contrast
Comparison: 03/09/2017.  03/03/2017.

CLINICAL DATA: Shortness of breath.  History of CHF .

EXAM:
CHEST  2 VIEW

[chest pa]
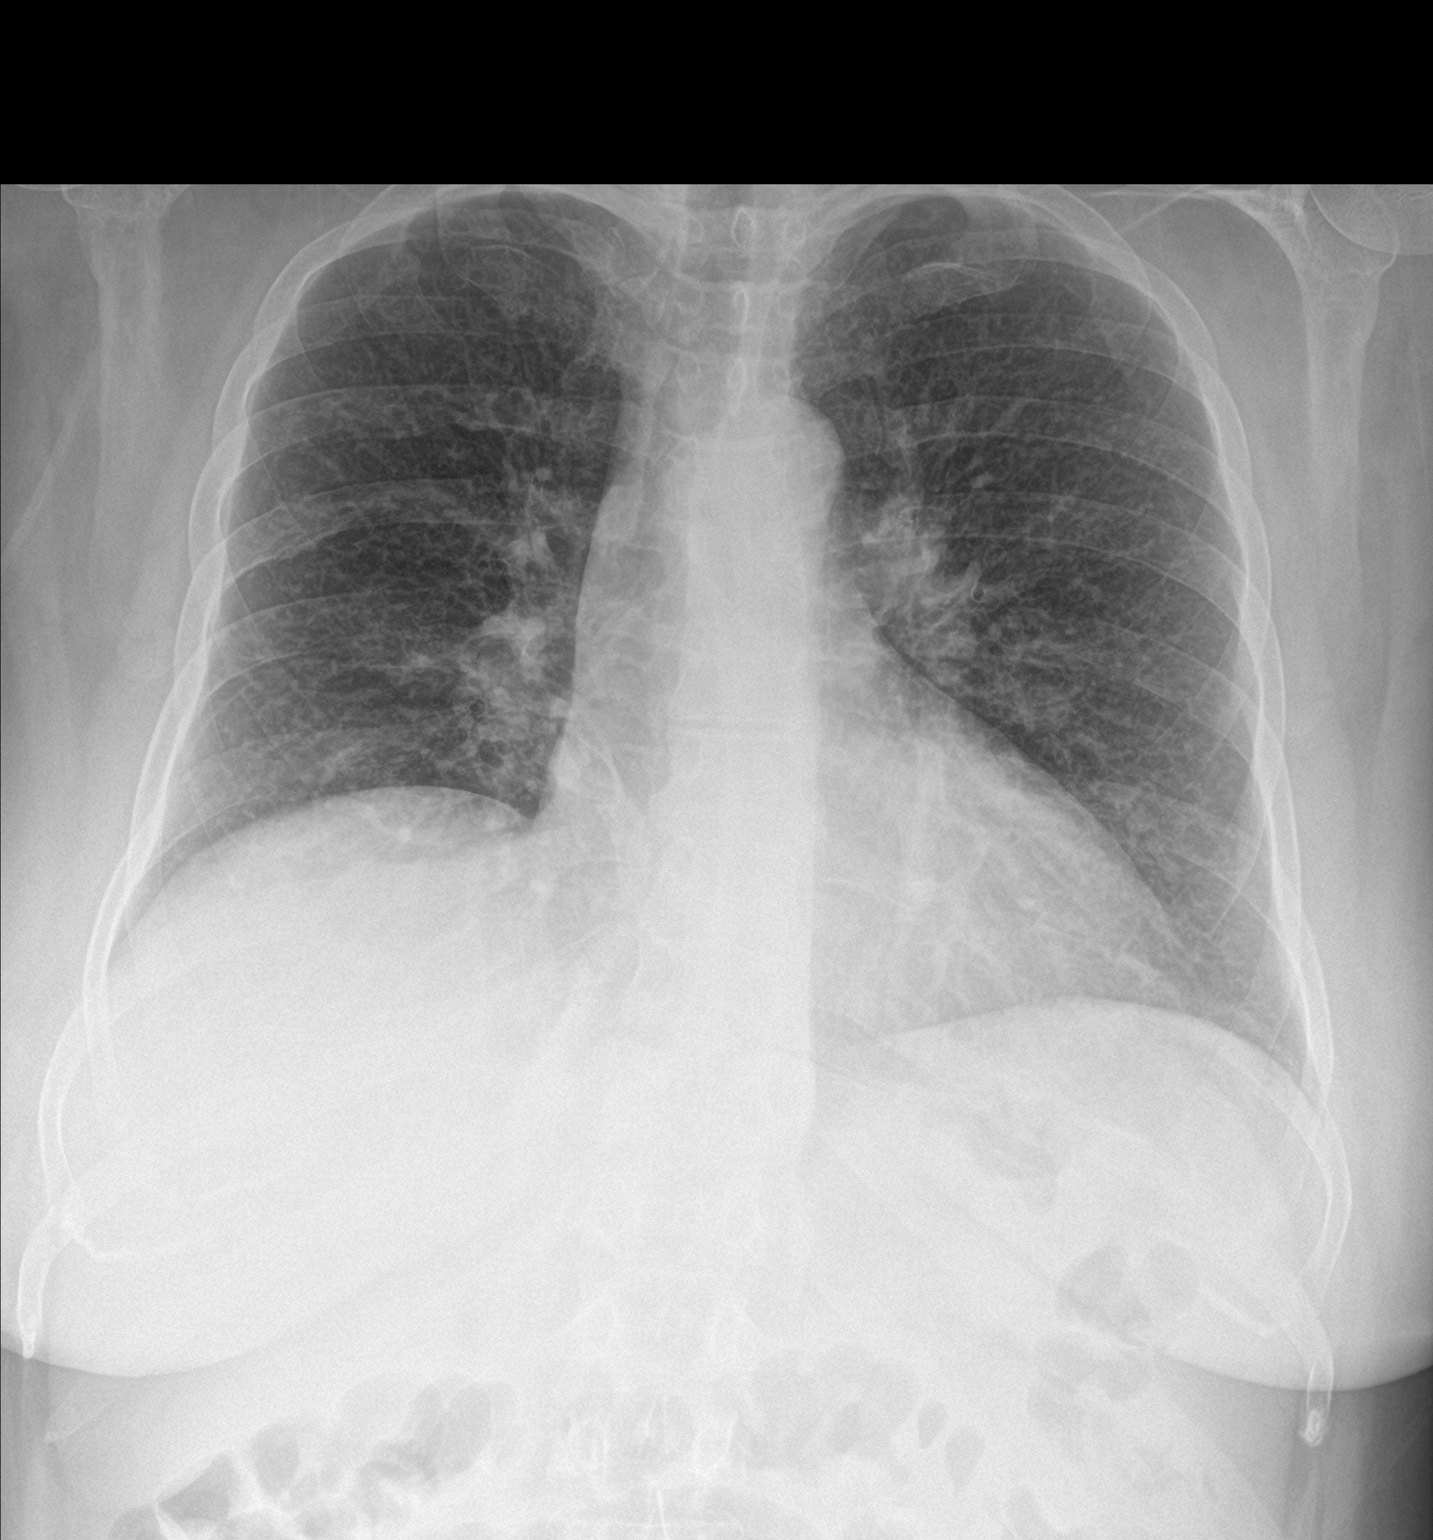

[chest lat]
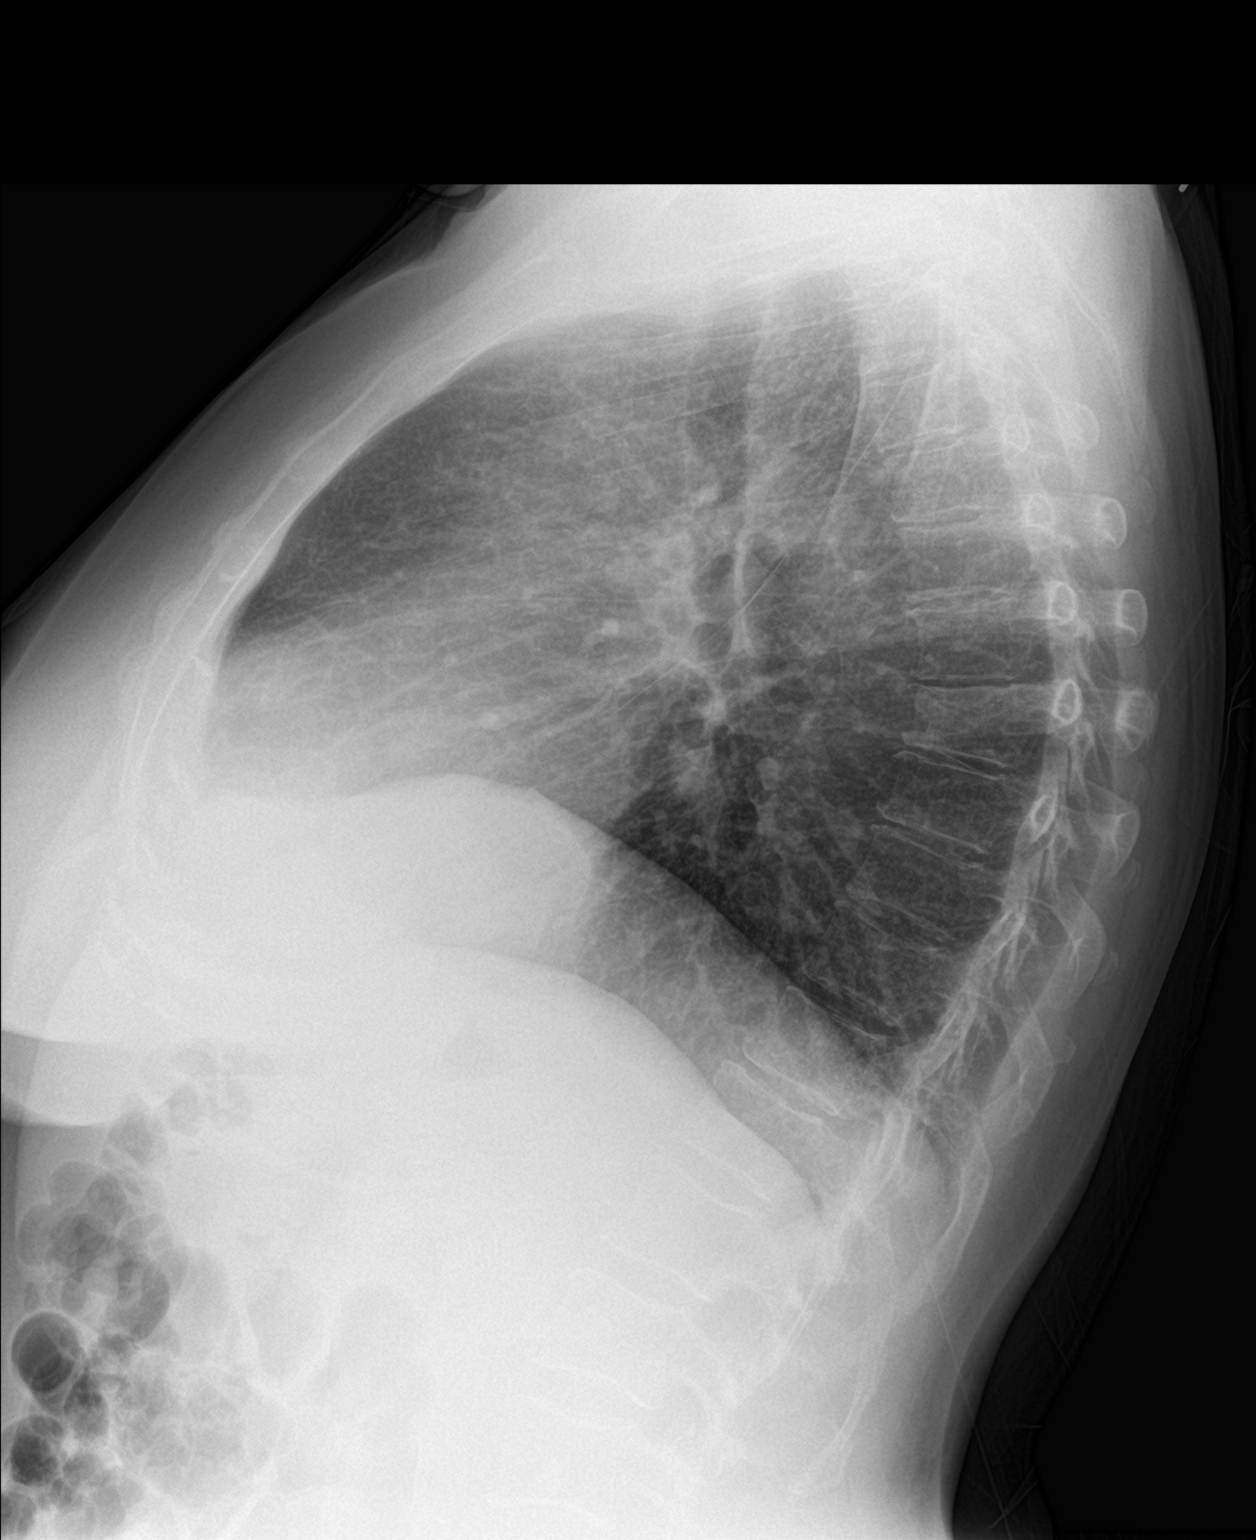

[2 of 2 positions shown; findings below may reference images not displayed]

FINDINGS: Cardiomegaly with mild pulmonary vascular prominence and mild
interstitial prominence noted consistent with mild CHF. Mild right
base subsegmental atelectasis. Degenerative changes thoracic spine
with mild mid thoracic vertebral body compression fracture, age
undetermined.
IMPRESSION: 1. Cardiomegaly with mild bilateral interstitial prominence. Mild
CHF cannot excluded.

2.  Mild right base subsegmental atelectasis.

## 2017-06-27 ENCOUNTER — Ambulatory Visit: Payer: Medicare Other | Admitting: Cardiovascular Disease
